# Patient Record
Sex: Male | Born: 1945 | Race: White | Hispanic: No | Marital: Married | Smoking: Current every day smoker
Health system: Southern US, Community
[De-identification: ages and names within clinical notes are randomized; demographics above are authoritative.]

## PROBLEM LIST (undated history)

## (undated) DIAGNOSIS — I10 Essential (primary) hypertension: Secondary | ICD-10-CM

## (undated) DIAGNOSIS — M5020 Other cervical disc displacement, unspecified cervical region: Secondary | ICD-10-CM

## (undated) DIAGNOSIS — E785 Hyperlipidemia, unspecified: Secondary | ICD-10-CM

## (undated) DIAGNOSIS — A281 Cat-scratch disease: Secondary | ICD-10-CM

## (undated) HISTORY — DX: Cat-scratch disease: A28.1

## (undated) HISTORY — DX: Other cervical disc displacement, unspecified cervical region: M50.20

## (undated) HISTORY — DX: Essential (primary) hypertension: I10

## (undated) HISTORY — PX: TONSILLECTOMY: SHX5217

## (undated) HISTORY — PX: CHOLECYSTECTOMY: SHX55

## (undated) HISTORY — DX: Hyperlipidemia, unspecified: E78.5

---

## 2004-09-22 ENCOUNTER — Ambulatory Visit: Payer: Self-pay | Admitting: Family Medicine

## 2004-10-20 ENCOUNTER — Ambulatory Visit: Payer: Self-pay | Admitting: Family Medicine

## 2004-10-25 ENCOUNTER — Ambulatory Visit: Payer: Self-pay | Admitting: Family Medicine

## 2005-12-13 ENCOUNTER — Ambulatory Visit: Payer: Self-pay | Admitting: Family Medicine

## 2008-05-21 ENCOUNTER — Telehealth: Payer: Self-pay | Admitting: Physician Assistant

## 2008-05-22 ENCOUNTER — Ambulatory Visit: Payer: Self-pay | Admitting: Gastroenterology

## 2008-05-22 DIAGNOSIS — R1032 Left lower quadrant pain: Secondary | ICD-10-CM | POA: Insufficient documentation

## 2008-05-22 DIAGNOSIS — K625 Hemorrhage of anus and rectum: Secondary | ICD-10-CM | POA: Insufficient documentation

## 2008-05-22 DIAGNOSIS — K5900 Constipation, unspecified: Secondary | ICD-10-CM | POA: Insufficient documentation

## 2008-05-22 LAB — CONVERTED CEMR LAB
Basophils Relative: 1.6 % (ref 0.0–3.0)
Eosinophils Relative: 2.5 % (ref 0.0–5.0)
HCT: 44.1 % (ref 39.0–52.0)
Lymphs Abs: 2.8 10*3/uL (ref 0.7–4.0)
MCV: 90.9 fL (ref 78.0–100.0)
Monocytes Absolute: 1.5 10*3/uL — ABNORMAL HIGH (ref 0.1–1.0)
Monocytes Relative: 8.9 % (ref 3.0–12.0)
Platelets: 282 10*3/uL (ref 150.0–400.0)
RBC: 4.85 M/uL (ref 4.22–5.81)
WBC: 17 10*3/uL — ABNORMAL HIGH (ref 4.5–10.5)

## 2008-06-09 ENCOUNTER — Encounter: Payer: Self-pay | Admitting: Gastroenterology

## 2008-06-09 ENCOUNTER — Ambulatory Visit: Payer: Self-pay | Admitting: Gastroenterology

## 2008-06-14 ENCOUNTER — Encounter: Payer: Self-pay | Admitting: Gastroenterology

## 2011-04-12 ENCOUNTER — Encounter: Payer: Self-pay | Admitting: Gastroenterology

## 2012-04-10 ENCOUNTER — Encounter: Payer: Self-pay | Admitting: Gastroenterology

## 2012-06-13 DIAGNOSIS — C44311 Basal cell carcinoma of skin of nose: Secondary | ICD-10-CM | POA: Insufficient documentation

## 2013-09-23 ENCOUNTER — Encounter (INDEPENDENT_AMBULATORY_CARE_PROVIDER_SITE_OTHER): Payer: Self-pay

## 2013-09-23 ENCOUNTER — Ambulatory Visit (INDEPENDENT_AMBULATORY_CARE_PROVIDER_SITE_OTHER): Payer: Medicare Other | Admitting: Family Medicine

## 2013-09-23 VITALS — BP 122/75 | HR 73 | Temp 96.9°F | Ht 71.0 in | Wt 212.0 lb

## 2013-09-23 DIAGNOSIS — M5416 Radiculopathy, lumbar region: Secondary | ICD-10-CM

## 2013-09-23 DIAGNOSIS — IMO0002 Reserved for concepts with insufficient information to code with codable children: Secondary | ICD-10-CM

## 2013-09-23 MED ORDER — METHYLPREDNISOLONE (PAK) 4 MG PO TABS: ORAL_TABLET | ORAL | Status: DC

## 2013-09-23 NOTE — Progress Notes (Signed)
   Subjective:    Patient ID: [name], male    DOB: 1945/07/19, 68 y.o.   MRN: [id number]  HPI This 68 y.o. male presents for evaluation of left back pain that radiates down left leg.   Review of Systems No chest pain, SOB, HA, dizziness, vision change, N/V, diarrhea, constipation, dysuria, urinary urgency or frequency, myalgias, arthralgias or rash.     Objective:   Physical Exam  Vital signs noted  Well developed well nourished male.  HEENT - Head ATNC Respiratory - Lungs CTA bilateral Cardiac - RRR S1 and S2 without murmur GI - Abdomen soft Nontender and bowel sounds active x 4 Extremities - No edema. Neuro - Grossly intact. MS - TTP left lumbar paraspinous muscles     Assessment & Plan:  Lumbar radiculopathy - Plan: methylPREDNIsolone (MEDROL DOSPACK) 4 MG tablet  Lysbeth Penner FNP

## 2013-11-11 ENCOUNTER — Encounter: Payer: Self-pay | Admitting: Family Medicine

## 2013-11-14 ENCOUNTER — Ambulatory Visit (INDEPENDENT_AMBULATORY_CARE_PROVIDER_SITE_OTHER): Payer: Medicare Other | Admitting: Family Medicine

## 2013-11-14 ENCOUNTER — Encounter: Payer: Self-pay | Admitting: Family Medicine

## 2013-11-14 VITALS — BP 123/76 | HR 76 | Temp 96.5°F | Ht 71.0 in | Wt 207.4 lb

## 2013-11-14 DIAGNOSIS — R208 Other disturbances of skin sensation: Secondary | ICD-10-CM

## 2013-11-14 DIAGNOSIS — I10 Essential (primary) hypertension: Secondary | ICD-10-CM

## 2013-11-14 DIAGNOSIS — R200 Anesthesia of skin: Secondary | ICD-10-CM

## 2013-11-14 DIAGNOSIS — E785 Hyperlipidemia, unspecified: Secondary | ICD-10-CM

## 2013-11-14 NOTE — Progress Notes (Signed)
   Subjective:    Patient ID: Carl Horne, male    DOB: 02/28/1945, 68 y.o.   MRN: [id number]  HPI 68 year old gentleman who gets care at the [place] as well as here. He is relatively healthy, being treated only for hypertension and hyperlipidemia. He has a past history of lumbar disc disease which flared up some years ago but then again more recently after crawling around under his house. He now has numbness on the lateral aspect of his left leg but denies pain.    Review of Systems  Respiratory: Positive for cough.        Objective:   Physical Exam  Constitutional: He is oriented to person, place, and time. He appears well-developed and well-nourished.  HENT:  Head: Normocephalic.  Eyes: Pupils are equal, round, and reactive to light.  Cardiovascular: Normal rate.   Pulmonary/Chest: Effort normal.  Musculoskeletal: Normal range of motion.  Straight leg raising is negative. Reflexes are diminished on the left compared to the right. There is some decrease in strength with dorsiflexion of the left foot  Neurological: He is alert and oriented to person, place, and time.    BP 123/76 mmHg  Pulse 76  Temp(Src) 96.5 F (35.8 C) (Oral)  Ht 5\' 11"  (1.803 m)  Wt 207 lb 6.4 oz (94.076 kg)  BMI 28.94 kg/m2      Assessment & Plan:  1. Essential hypertension   2. Hyperlipidemia   3. Numbness in left leg Suspect he has recurrence of lumbar disc disease. He is scheduled for MRI at the [place] in 3 weeks I have encouraged him to keep that appointment but if symptoms worsen before but has not  He says that he gets his lab work such as lipids and liver done at the [place]. I will assume this is adequate but have asked him to try and procure these results  Wardell Honour MD

## 2013-11-14 NOTE — Patient Instructions (Signed)

## 2013-12-15 ENCOUNTER — Ambulatory Visit: Payer: Medicare Other | Admitting: *Deleted

## 2013-12-15 ENCOUNTER — Ambulatory Visit (INDEPENDENT_AMBULATORY_CARE_PROVIDER_SITE_OTHER): Payer: Medicare Other | Admitting: *Deleted

## 2013-12-15 DIAGNOSIS — Z23 Encounter for immunization: Secondary | ICD-10-CM

## 2014-03-10 DIAGNOSIS — M4726 Other spondylosis with radiculopathy, lumbar region: Secondary | ICD-10-CM | POA: Insufficient documentation

## 2014-03-10 DIAGNOSIS — M21372 Foot drop, left foot: Secondary | ICD-10-CM | POA: Insufficient documentation

## 2014-03-10 DIAGNOSIS — M5136 Other intervertebral disc degeneration, lumbar region: Secondary | ICD-10-CM | POA: Insufficient documentation

## 2014-03-10 DIAGNOSIS — M4800 Spinal stenosis, site unspecified: Secondary | ICD-10-CM | POA: Insufficient documentation

## 2014-03-20 ENCOUNTER — Ambulatory Visit: Payer: Self-pay | Admitting: Family Medicine

## 2014-03-20 DIAGNOSIS — J069 Acute upper respiratory infection, unspecified: Secondary | ICD-10-CM | POA: Diagnosis not present

## 2014-04-22 DIAGNOSIS — J069 Acute upper respiratory infection, unspecified: Secondary | ICD-10-CM | POA: Diagnosis not present

## 2014-08-06 ENCOUNTER — Encounter: Payer: Self-pay | Admitting: Gastroenterology

## 2014-11-04 ENCOUNTER — Encounter: Payer: Self-pay | Admitting: Family Medicine

## 2014-11-05 ENCOUNTER — Encounter: Payer: Self-pay | Admitting: Family Medicine

## 2014-12-01 ENCOUNTER — Ambulatory Visit (INDEPENDENT_AMBULATORY_CARE_PROVIDER_SITE_OTHER): Payer: Medicare Other | Admitting: Family Medicine

## 2014-12-01 ENCOUNTER — Encounter: Payer: Self-pay | Admitting: Family Medicine

## 2014-12-01 VITALS — BP 131/78 | HR 66 | Temp 96.7°F | Ht 71.0 in | Wt 216.0 lb

## 2014-12-01 DIAGNOSIS — I10 Essential (primary) hypertension: Secondary | ICD-10-CM

## 2014-12-01 DIAGNOSIS — E785 Hyperlipidemia, unspecified: Secondary | ICD-10-CM | POA: Diagnosis not present

## 2014-12-01 NOTE — Progress Notes (Signed)
   Subjective:    Patient ID: Carl Horne, male    DOB: January 13, 1945, 69 y.o.   MRN: [id number]  HPI  69 year old gentleman who is here for follow-up check on blood pressure and lipids. He is followed for these problems at the [facility] clinic in [place] and has had blood work there. He has a history of disc disease in his back with resultant foot drop on the left side. He wears an AFO splint when he is going to do lots of walking. He denies pain. He has had symptoms for the past 2 years.  Patient Active Problem List   Diagnosis Date Noted  . CONSTIPATION 05/22/2008  . RECTAL BLEEDING 05/22/2008  . Abdominal pain, left lower quadrant 05/22/2008   Outpatient Encounter Prescriptions as of 12/01/2014  Medication Sig  . aspirin EC 81 MG tablet Take 81 mg by mouth daily.  . Cholecalciferol (VITAMIN D) 2000 UNITS CAPS Take 1 capsule by mouth daily.  . cloNIDine (CATAPRES) 0.3 MG tablet Take 0.3 mg by mouth 2 (two) times daily.  Marland Kitchen lisinopril (PRINIVIL,ZESTRIL) 40 MG tablet Take 40 mg by mouth daily.  . pravastatin (PRAVACHOL) 20 MG tablet Take 20 mg by mouth daily.   No facility-administered encounter medications on file as of 12/01/2014.      Review of Systems  Constitutional: Negative.   Respiratory: Negative.   Cardiovascular: Negative.   Genitourinary: Negative.   Neurological: Negative.   Psychiatric/Behavioral: Negative.        Objective:   Physical Exam  Constitutional: He is oriented to person, place, and time. He appears well-nourished.  Cardiovascular: Normal rate and regular rhythm.   Pulmonary/Chest: Effort normal and breath sounds normal.  Neurological: He is alert and oriented to person, place, and time. He exhibits abnormal muscle tone.  Foot drop is evident on the left side. He is unable to do heel walking.          Assessment & Plan:  1. Essential hypertension Blood pressure is well controlled on combination clonidine and lisinopril  2. Hyperlipidemia I have  no results of lipid and liver labs that he gets at the [place]. I have asked him to bring a report so we can scan that into his record. Otherwise it looks like we are not following him very well.  Wardell Honour MD

## 2015-01-21 ENCOUNTER — Encounter: Payer: Self-pay | Admitting: Family Medicine

## 2015-01-21 ENCOUNTER — Ambulatory Visit (INDEPENDENT_AMBULATORY_CARE_PROVIDER_SITE_OTHER): Payer: Medicare Other | Admitting: Family Medicine

## 2015-01-21 VITALS — BP 153/90 | HR 77 | Temp 96.9°F | Ht 71.0 in | Wt 214.6 lb

## 2015-01-21 DIAGNOSIS — I10 Essential (primary) hypertension: Secondary | ICD-10-CM | POA: Diagnosis not present

## 2015-01-21 MED ORDER — SILDENAFIL CITRATE 100 MG PO TABS: 50.0000 mg | ORAL_TABLET | Freq: Every day | ORAL | Status: DC | PRN

## 2015-01-21 NOTE — Progress Notes (Signed)
   Subjective:    Patient ID: Carl Horne, male    DOB: 1945/09/03, 70 y.o.   MRN: [id number]  HPI 70 year old gentleman who is here to follow-up hypertension and hyperlipidemia. He is also seen in the [place] clinic in [place] and he brings in today report of electrolytes renal function liver functions done in November at the [facility] clinic all of these are within normal limits he did not bring his cholesterol and lipid labs and have asked him to try to get that done when he goes back to the [place] probably in May of this year.  His other concern today is erectile dysfunction. He had tried some Viagra that her friend had given him before with good success  Patient Active Problem List   Diagnosis Date Noted  . CONSTIPATION 05/22/2008  . RECTAL BLEEDING 05/22/2008  . Abdominal pain, left lower quadrant 05/22/2008   Outpatient Encounter Prescriptions as of 01/21/2015  Medication Sig  . aspirin EC 81 MG tablet Take 81 mg by mouth daily.  . Cholecalciferol (VITAMIN D) 2000 UNITS CAPS Take 1 capsule by mouth daily.  . cloNIDine (CATAPRES) 0.3 MG tablet Take 0.3 mg by mouth 2 (two) times daily.  Marland Kitchen lisinopril (PRINIVIL,ZESTRIL) 40 MG tablet Take 40 mg by mouth daily.  . pravastatin (PRAVACHOL) 20 MG tablet Take 20 mg by mouth daily.   No facility-administered encounter medications on file as of 01/21/2015.      Review of Systems  Constitutional: Negative.   Respiratory: Negative.   Cardiovascular: Negative.   Neurological: Negative.   Psychiatric/Behavioral: Negative.        Objective:   Physical Exam  Constitutional: He is oriented to person, place, and time. He appears well-developed and well-nourished.  Cardiovascular: Normal rate and regular rhythm.   Pulmonary/Chest: Effort normal and breath sounds normal.  Neurological: He is alert and oriented to person, place, and time.  Psychiatric: He has a normal mood and affect. His behavior is normal.          Assessment & Plan:  1.  Essential hypertension Currently patient is on clonidine 0.3 mg twice a day and lisinopril 40 mg every morning. Blood pressures are elevated today.  I asked him to monitor pressure at home. Also asked him to bring back for results of lipid measurement in now 4 months.  Wardell Honour MD

## 2015-07-07 ENCOUNTER — Ambulatory Visit (INDEPENDENT_AMBULATORY_CARE_PROVIDER_SITE_OTHER): Payer: Medicare Other | Admitting: Family

## 2015-07-07 ENCOUNTER — Encounter: Payer: Self-pay | Admitting: Family

## 2015-07-07 VITALS — BP 156/85 | HR 70 | Temp 97.0°F | Ht 71.0 in | Wt 205.6 lb

## 2015-07-07 DIAGNOSIS — M25512 Pain in left shoulder: Secondary | ICD-10-CM

## 2015-07-07 DIAGNOSIS — S46912A Strain of unspecified muscle, fascia and tendon at shoulder and upper arm level, left arm, initial encounter: Secondary | ICD-10-CM

## 2015-07-07 MED ORDER — NAPROXEN 500 MG PO TABS: 500.0000 mg | ORAL_TABLET | Freq: Two times a day (BID) | ORAL | Status: DC

## 2015-07-07 MED ORDER — PREDNISONE 10 MG (21) PO TBPK: 10.0000 mg | ORAL_TABLET | Freq: Every day | ORAL | Status: DC

## 2015-07-07 NOTE — Patient Instructions (Signed)
Shoulder Sprain °A shoulder sprain is a partial or complete tear in one of the tough, fiber-like tissues (ligaments) in the shoulder. The ligaments in the shoulder help to hold the shoulder in place. °CAUSES °This condition may be caused by: °· A fall. °· A hit to the shoulder. °· A twist of the arm. °RISK FACTORS °This condition is more likely to develop in: °· People who play sports. °· People who have problems with balance or coordination. °SYMPTOMS °Symptoms of this condition include: °· Pain when moving the shoulder. °· Limited ability to move the shoulder. °· Swelling and tenderness on top of the shoulder. °· Warmth in the shoulder. °· A change in the shape of the shoulder. °· Redness or bruising on the shoulder. °DIAGNOSIS °This condition is diagnosed with a physical exam. During the exam, you may be asked to do simple exercises with your shoulder. You may also have imaging tests, such as X-rays, MRI, or a CT scan. These tests can show how severe the sprain is. °TREATMENT °This condition may be treated with: °· Rest. °· Pain medicine. °· Ice. °· A sling or brace. This is used to keep the arm still while the shoulder is healing. °· Physical therapy or rehabilitation exercises. These help to improve the range of motion and strength of the shoulder. °· Surgery (rare). Surgery may be needed if the sprain caused a joint to become unstable. Surgery may also be needed to reduce pain. °Some people may develop ongoing shoulder pain or lose some range of motion in the shoulder. However, most people do not develop long-term problems. °HOME CARE INSTRUCTIONS °· Rest. °· Take over-the-counter and prescription medicines only as told by your health care provider. °· If directed, apply ice to the area: °¨ Put ice in a plastic bag. °¨ Place a towel between your skin and the bag. °¨ Leave the ice on for 20 minutes, 2-3 times per day. °· If you were given a shoulder sling or brace: °¨ Wear it as told. °¨ Remove it to shower or  bathe. °¨ Move your arm only as much as told by your health care provider, but keep your hand moving to prevent swelling. °· If you were shown how to do any exercises, do them as told by your health care provider. °· Keep all follow-up visits as told by your health care provider. This is important. °SEEK MEDICAL CARE IF: °· Your pain gets worse. °· Your pain is not relieved with medicines. °· You have increased redness or swelling. °SEEK IMMEDIATE MEDICAL CARE IF: °· You have a fever. °· You cannot move your arm or shoulder. °· You develop numbness or tingling in your arms, hands, or fingers. °  °This information is not intended to replace advice given to you by your health care provider. Make sure you discuss any questions you have with your health care provider. °  °Document Released: 05/07/2008 Document Revised: 09/09/2014 Document Reviewed: 04/13/2014 °Elsevier Interactive Patient Education ©2016 [employer] ° °

## 2015-07-07 NOTE — Progress Notes (Signed)
   Subjective:    Patient ID: Carl Horne, male    DOB: 11/26/1945, 70 y.o.   MRN: [id number]  Shoulder Pain  The pain is present in the neck and left shoulder. This is a new problem. The current episode started in the past 7 days (Left). There has been a history of trauma ("hauled gravel with a shovel last week"). The problem occurs constantly. The problem has been unchanged. The quality of the pain is described as aching. The pain is at a severity of 10/10. The pain is severe. Associated symptoms include joint swelling and stiffness. Pertinent negatives include no inability to bear weight, limited range of motion, numbness or tingling. The symptoms are aggravated by activity. He has tried rest and acetaminophen for the symptoms. The treatment provided no relief.      Review of Systems  Constitutional: Negative.   HENT: Negative.   Respiratory: Negative.   Cardiovascular: Negative.   Gastrointestinal: Negative.   Endocrine: Negative.   Genitourinary: Negative.   Musculoskeletal: Positive for joint swelling, arthralgias, stiffness and neck pain.  Neurological: Negative.  Negative for tingling and numbness.  Hematological: Negative.   Psychiatric/Behavioral: Negative.   All other systems reviewed and are negative.      Objective:   Physical Exam  Constitutional: He is oriented to person, place, and time. He appears well-developed and well-nourished. No distress.  HENT:  Head: Normocephalic.  Neck: Normal range of motion. Neck supple. No thyromegaly present.  Cardiovascular: Normal rate, regular rhythm, normal heart sounds and intact distal pulses.   No murmur heard. Pulmonary/Chest: Effort normal and breath sounds normal. No respiratory distress. He has no wheezes.  Abdominal: Soft. Bowel sounds are normal. He exhibits no distension. There is no tenderness.  Musculoskeletal: Normal range of motion. He exhibits no edema or tenderness.  FUll ROM of left shoulder, pain with flexion  and rotation  Neurological: He is alert and oriented to person, place, and time.  Skin: Skin is warm and dry. No rash noted. No erythema.  Psychiatric: He has a normal mood and affect. His behavior is normal. Judgment and thought content normal.  Vitals reviewed.   BP 156/85 mmHg  Pulse 70  Temp(Src) 97 F (36.1 C) (Oral)  Ht 5\' 11"  (1.803 m)  Wt 205 lb 9.6 oz (93.26 kg)  BMI 28.69 kg/m2       Assessment & Plan:  1. Left shoulder pain - predniSONE (STERAPRED UNI-PAK 21 TAB) 10 MG (21) TBPK tablet; Take 1 tablet (10 mg total) by mouth daily. As directed x 6 days  Dispense: 21 tablet; Refill: 0 - naproxen (NAPROSYN) 500 MG tablet; Take 1 tablet (500 mg total) by mouth 2 (two) times daily with a meal.  Dispense: 60 tablet; Refill: 1  2. Left shoulder strain, initial encounter -Rest -Ice -ROM exercises encouraged -Take naprosyn for 5-7 days BID with food -RTO in 2 weeks if not improved - predniSONE (STERAPRED UNI-PAK 21 TAB) 10 MG (21) TBPK tablet; Take 1 tablet (10 mg total) by mouth daily. As directed x 6 days  Dispense: 21 tablet; Refill: 0 - naproxen (NAPROSYN) 500 MG tablet; Take 1 tablet (500 mg total) by mouth 2 (two) times daily with a meal.  Dispense: 60 tablet; Refill: [place], FNP

## 2015-07-15 ENCOUNTER — Ambulatory Visit (INDEPENDENT_AMBULATORY_CARE_PROVIDER_SITE_OTHER): Payer: Medicare Other | Admitting: Nurse Practitioner

## 2015-07-15 ENCOUNTER — Encounter: Payer: Self-pay | Admitting: Nurse Practitioner

## 2015-07-15 VITALS — BP 115/69 | HR 72 | Temp 97.2°F | Ht 71.0 in | Wt 207.0 lb

## 2015-07-15 DIAGNOSIS — M542 Cervicalgia: Secondary | ICD-10-CM | POA: Diagnosis not present

## 2015-07-15 DIAGNOSIS — M25512 Pain in left shoulder: Secondary | ICD-10-CM

## 2015-07-15 MED ORDER — METHYLPREDNISOLONE ACETATE 80 MG/ML IJ SUSP
80.0000 mg | Freq: Once | INTRAMUSCULAR | Status: AC
  Administered 2015-07-15: 80 mg via INTRAMUSCULAR

## 2015-07-15 MED ORDER — TRAMADOL HCL 50 MG PO TABS: 50.0000 mg | ORAL_TABLET | Freq: Three times a day (TID) | ORAL | Status: DC | PRN

## 2015-07-15 MED ORDER — CYCLOBENZAPRINE HCL 5 MG PO TABS: 5.0000 mg | ORAL_TABLET | Freq: Three times a day (TID) | ORAL | Status: DC | PRN

## 2015-07-15 NOTE — Patient Instructions (Signed)

## 2015-07-15 NOTE — Progress Notes (Signed)
   Subjective:    Patient ID: Carl Horne, male    DOB: [birth year], 70 y.o.   MRN: [id number]  HPI Patient in c/o left shoulder , neck and upper back pain- started 2 weeks ago-he was loading gravel off of a pick up truck and started hurting 2 days later. Describes pain as throbbing and aching- rates pain 10-10 currently. Sitting straight up and walking around increases pain- sitting with pressure on back relieves pain some.    Review of Systems  Respiratory: Negative.  Negative for shortness of breath.   Cardiovascular: Positive for chest pain.  Genitourinary: Negative.   Neurological: Negative.   Psychiatric/Behavioral: Negative.   All other systems reviewed and are negative.      Objective:   Physical Exam  Constitutional: He is oriented to person, place, and time. He appears well-developed and well-nourished.  Cardiovascular: Normal rate, regular rhythm and normal heart sounds.   Pulmonary/Chest: Effort normal and breath sounds normal.  Musculoskeletal:  FROM of left shoulder with slight pain on internal rotation. Pain on palpation of lower  Cervical spine  FROM of neck without pain Left grip slightly weaker then right  Neurological: He is alert and oriented to person, place, and time. He has normal reflexes.  Skin: Skin is warm.  Psychiatric: He has a normal mood and affect. His behavior is normal. Judgment and thought content normal.   BP 115/69 mmHg  Pulse 72  Temp(Src) 97.2 F (36.2 C) (Oral)  Ht 5\' 11"  (1.803 m)  Wt 207 lb (93.895 kg)  BMI 28.88 kg/m2        Assessment & Plan:   1. Left shoulder pain   2. Cervical pain (neck)    Meds ordered this encounter  Medications  . cyclobenzaprine (FLEXERIL) 5 MG tablet    Sig: Take 1 tablet (5 mg total) by mouth 3 (three) times daily as needed for muscle spasms.    Dispense:  30 tablet    Refill:  2    Order Specific Question:  Supervising Provider    Answer:  VINCENT, CAROL L [[id number]]  . methylPREDNISolone  acetate (DEPO-MEDROL) injection 80 mg    Sig:   . traMADol (ULTRAM) 50 MG tablet    Sig: Take 1 tablet (50 mg total) by mouth every 8 (eight) hours as needed.    Dispense:  20 tablet    Refill:  0    Order Specific Question:  Supervising Provider    Answer:  Evette Doffing, CAROL L [[id number]]   Moist heat Rest RTO prn  Mary-Margaret Hassell Done, FNP

## 2015-07-19 ENCOUNTER — Encounter: Payer: Self-pay | Admitting: Family

## 2015-07-19 ENCOUNTER — Ambulatory Visit (INDEPENDENT_AMBULATORY_CARE_PROVIDER_SITE_OTHER): Payer: Medicare Other | Admitting: Family

## 2015-07-19 VITALS — BP 176/86 | HR 64 | Temp 96.8°F | Ht 71.0 in | Wt 205.6 lb

## 2015-07-19 DIAGNOSIS — M542 Cervicalgia: Secondary | ICD-10-CM | POA: Diagnosis not present

## 2015-07-19 DIAGNOSIS — M25512 Pain in left shoulder: Secondary | ICD-10-CM | POA: Diagnosis not present

## 2015-07-19 MED ORDER — KETOROLAC TROMETHAMINE 60 MG/2ML IM SOLN: 30.0000 mg | Freq: Once | INTRAMUSCULAR | Status: DC

## 2015-07-19 MED ORDER — CYCLOBENZAPRINE HCL 10 MG PO TABS: 10.0000 mg | ORAL_TABLET | Freq: Three times a day (TID) | ORAL | Status: DC | PRN

## 2015-07-19 MED ORDER — KETOROLAC TROMETHAMINE 60 MG/2ML IM SOLN
30.0000 mg | Freq: Once | INTRAMUSCULAR | Status: AC
  Administered 2015-07-19: 30 mg via INTRAMUSCULAR

## 2015-07-19 MED ORDER — TRAMADOL HCL 50 MG PO TABS: 100.0000 mg | ORAL_TABLET | Freq: Three times a day (TID) | ORAL | Status: DC | PRN

## 2015-07-19 NOTE — Progress Notes (Signed)
Subjective:    Patient ID: Carl Horne, male    DOB: 08-04-1945, 70 y.o.   MRN: [id number]  Neck Pain  This is a new problem. The current episode started 1 to 4 weeks ago. The problem occurs constantly. The problem has been gradually worsening. The pain is associated with nothing. Associated symptoms include numbness and tingling (left hand). Pertinent negatives include no leg pain.  Back Pain This is a recurrent problem. The current episode started 1 to 4 weeks ago. The problem occurs constantly. The pain is present in the thoracic spine. The quality of the pain is described as aching. The pain is at a severity of 10/10. The pain is moderate. Associated symptoms include numbness and tingling (left hand). Pertinent negatives include no bladder incontinence, bowel incontinence, dysuria or leg pain. He has tried muscle relaxant and bed rest for the symptoms. The treatment provided mild relief.      Review of Systems  Constitutional: Negative.   HENT: Negative.   Respiratory: Negative.   Cardiovascular: Negative.   Gastrointestinal: Negative.  Negative for bowel incontinence.  Endocrine: Negative.   Genitourinary: Negative.  Negative for bladder incontinence and dysuria.  Musculoskeletal: Positive for back pain and neck pain.  Neurological: Positive for tingling (left hand) and numbness.  Hematological: Negative.   Psychiatric/Behavioral: Negative.   All other systems reviewed and are negative.      Objective:   Physical Exam  Constitutional: He is oriented to person, place, and time. He appears well-developed and well-nourished. No distress.  Cardiovascular: Normal rate, regular rhythm, normal heart sounds and intact distal pulses.   No murmur heard. Pulmonary/Chest: Effort normal and breath sounds normal. No respiratory distress. He has no wheezes.  Abdominal: Soft. Bowel sounds are normal. He exhibits no distension. There is no tenderness.  Musculoskeletal: He exhibits no edema  or tenderness.  Pain with rotation of left shoulder, pain in lower cervical area with bending   Neurological: He is alert and oriented to person, place, and time. He has normal reflexes. No cranial nerve deficit.  Skin: Skin is warm and dry. No rash noted. No erythema.  Psychiatric: He has a normal mood and affect. His behavior is normal. Judgment and thought content normal.  Vitals reviewed.   BP 176/86 mmHg  Pulse 64  Temp(Src) 96.8 F (36 C) (Oral)  Ht 5\' 11"  (1.803 m)  Wt 205 lb 9.6 oz (93.26 kg)  BMI 28.69 kg/m2       Assessment & Plan:  1. Cervical pain (neck) - Ambulatory referral to Orthopedic Surgery - traMADol (ULTRAM) 50 MG tablet; Take 2 tablets (100 mg total) by mouth every 8 (eight) hours as needed.  Dispense: 30 tablet; Refill: 0 - cyclobenzaprine (FLEXERIL) 10 MG tablet; Take 1 tablet (10 mg total) by mouth 3 (three) times daily as needed for muscle spasms.  Dispense: 30 tablet; Refill: 0 - ketorolac (TORADOL) injection 30 mg; Inject 1 mL (30 mg total) into the muscle once.  2. Left shoulder pain - Ambulatory referral to Orthopedic Surgery - traMADol (ULTRAM) 50 MG tablet; Take 2 tablets (100 mg total) by mouth every 8 (eight) hours as needed.  Dispense: 30 tablet; Refill: 0 - cyclobenzaprine (FLEXERIL) 10 MG tablet; Take 1 tablet (10 mg total) by mouth 3 (three) times daily as needed for muscle spasms.  Dispense: 30 tablet; Refill: 0 - ketorolac (TORADOL) injection 30 mg; Inject 1 mL (30 mg total) into the muscle once.  Rest Ice and heat as needed  Increased flexeril to 10 mg from 5 mg Told to continue naprosyn -Ultram rx increased to 100 mg from 50 mg Referral pending  Evelina Dun, FNP

## 2015-07-19 NOTE — Patient Instructions (Signed)

## 2015-08-19 DIAGNOSIS — M542 Cervicalgia: Secondary | ICD-10-CM | POA: Diagnosis not present

## 2016-06-05 ENCOUNTER — Telehealth: Payer: Self-pay | Admitting: Family Medicine

## 2016-09-01 ENCOUNTER — Ambulatory Visit (INDEPENDENT_AMBULATORY_CARE_PROVIDER_SITE_OTHER): Payer: Medicare Other | Admitting: Physician Assistant

## 2016-09-01 ENCOUNTER — Encounter: Payer: Self-pay | Admitting: Physician Assistant

## 2016-09-01 VITALS — BP 98/57 | HR 67 | Temp 96.7°F | Ht 71.0 in | Wt 210.0 lb

## 2016-09-01 DIAGNOSIS — E7800 Pure hypercholesterolemia, unspecified: Secondary | ICD-10-CM | POA: Diagnosis not present

## 2016-09-01 DIAGNOSIS — L03113 Cellulitis of right upper limb: Secondary | ICD-10-CM | POA: Diagnosis not present

## 2016-09-01 DIAGNOSIS — I10 Essential (primary) hypertension: Secondary | ICD-10-CM | POA: Diagnosis not present

## 2016-09-01 MED ORDER — CLONIDINE HCL 0.3 MG PO TABS: 0.3000 mg | ORAL_TABLET | Freq: Two times a day (BID) | ORAL | 0 refills | Status: DC

## 2016-09-01 MED ORDER — CLINDAMYCIN HCL 300 MG PO CAPS: 300.0000 mg | ORAL_CAPSULE | Freq: Three times a day (TID) | ORAL | 0 refills | Status: DC

## 2016-09-01 NOTE — Patient Instructions (Signed)

## 2016-09-03 DIAGNOSIS — W5503XA Scratched by cat, initial encounter: Secondary | ICD-10-CM | POA: Diagnosis not present

## 2016-09-03 DIAGNOSIS — I10 Essential (primary) hypertension: Secondary | ICD-10-CM | POA: Diagnosis not present

## 2016-09-03 DIAGNOSIS — Z7982 Long term (current) use of aspirin: Secondary | ICD-10-CM | POA: Diagnosis not present

## 2016-09-03 DIAGNOSIS — Z79899 Other long term (current) drug therapy: Secondary | ICD-10-CM | POA: Diagnosis not present

## 2016-09-03 DIAGNOSIS — L03113 Cellulitis of right upper limb: Secondary | ICD-10-CM | POA: Diagnosis not present

## 2016-09-04 DIAGNOSIS — E7800 Pure hypercholesterolemia, unspecified: Secondary | ICD-10-CM | POA: Insufficient documentation

## 2016-09-04 NOTE — Progress Notes (Signed)
BP (!) 98/57   Pulse 67   Temp (!) 96.7 F (35.9 C) (Oral)   Ht 5\' 11"  (1.803 m)   Wt 210 lb (95.3 kg)   BMI 29.29 kg/m    Subjective:    Patient ID: Carl Horne, male    DOB: [birth year], 71 y.o.   MRN: [id number]  HPI: Carl Horne is a 71 y.o. male presenting on 09/01/2016 for Hand Problem (Red and swelling. Was scratched by cat )  He had swelling come up in the past 2 days and warmth to the touch. Denies fever or chills.  He has history of gout but this does not feel the same.  He has not had pus or drainage.    He also needs refills on meds for high blood pressure and hyperlipidemia. Feels well on them.  Relevant past medical, surgical, family and social history reviewed and updated as indicated. Allergies and medications reviewed and updated.  Past Medical History:  Diagnosis Date  . Herniated cervical disc   . Hyperlipidemia   . Hypertension     Past Surgical History:  Procedure Laterality Date  . CHOLECYSTECTOMY    . TONSILLECTOMY      Review of Systems  Constitutional: Negative.  Negative for appetite change and fatigue.  Eyes: Negative for pain and visual disturbance.  Respiratory: Negative.  Negative for cough, chest tightness, shortness of breath and wheezing.   Cardiovascular: Negative.  Negative for chest pain, palpitations and leg swelling.  Gastrointestinal: Negative.  Negative for abdominal pain, diarrhea, nausea and vomiting.  Genitourinary: Negative.   Skin: Positive for color change and wound. Negative for rash.  Neurological: Negative.  Negative for weakness, numbness and headaches.  Psychiatric/Behavioral: Negative.     Allergies as of 09/01/2016   No Known Allergies     Medication List       Accurate as of 09/01/16 11:59 PM. Always use your most recent med list.          aspirin EC 81 MG tablet Take 81 mg by mouth daily.   atorvastatin 20 MG tablet Commonly known as:  LIPITOR Take 20 mg by mouth daily.   clindamycin 300 MG  capsule Commonly known as:  CLEOCIN Take 1 capsule (300 mg total) by mouth 3 (three) times daily.   cloNIDine 0.3 MG tablet Commonly known as:  CATAPRES Take 1 tablet (0.3 mg total) by mouth 2 (two) times daily.   lisinopril 40 MG tablet Commonly known as:  PRINIVIL,ZESTRIL Take 40 mg by mouth daily.   Vitamin D 2000 units Caps Take 1 capsule by mouth daily.            Discharge Care Instructions        Start     Ordered   09/01/16 0000  clindamycin (CLEOCIN) 300 MG capsule  3 times daily    Question:  Supervising Provider  Answer:  Timmothy Euler   09/01/16 0847   09/01/16 0000  cloNIDine (CATAPRES) 0.3 MG tablet  2 times daily    Question:  Supervising Provider  Answer:  Timmothy Euler   09/01/16 0849         Objective:    BP (!) 98/57   Pulse 67   Temp (!) 96.7 F (35.9 C) (Oral)   Ht 5\' 11"  (1.803 m)   Wt 210 lb (95.3 kg)   BMI 29.29 kg/m   No Known Allergies  Physical Exam  Constitutional: He appears well-developed and well-nourished. No  distress.  HENT:  Head: Normocephalic and atraumatic.  Eyes: Pupils are equal, round, and reactive to light. Conjunctivae and EOM are normal.  Cardiovascular: Normal rate, regular rhythm and normal heart sounds.   Pulmonary/Chest: Effort normal and breath sounds normal. No respiratory distress.  Skin: Skin is warm and dry. Rash noted. Rash is maculopapular. Rash is not pustular. There is erythema.     Warm and red right wrist  Psychiatric: He has a normal mood and affect. His behavior is normal.  Nursing note and vitals reviewed.       Assessment & Plan:   1. Cellulitis of right upper extremity - clindamycin (CLEOCIN) 300 MG capsule; Take 1 capsule (300 mg total) by mouth 3 (three) times daily.  Dispense: 30 capsule; Refill: 0  2. Essential hypertension - cloNIDine (CATAPRES) 0.3 MG tablet; Take 1 tablet (0.3 mg total) by mouth 2 (two) times daily.  Dispense: 60 tablet; Refill: 0  3. Pure  hypercholesterolemia - atorvastatin (LIPITOR) 20 MG tablet; Take 20 mg by mouth daily.    Current Outpatient Prescriptions:  .  aspirin EC 81 MG tablet, Take 81 mg by mouth daily., Disp: , Rfl:  .  atorvastatin (LIPITOR) 20 MG tablet, Take 20 mg by mouth daily., Disp: , Rfl:  .  Cholecalciferol (VITAMIN D) 2000 UNITS CAPS, Take 1 capsule by mouth daily., Disp: , Rfl:  .  cloNIDine (CATAPRES) 0.3 MG tablet, Take 1 tablet (0.3 mg total) by mouth 2 (two) times daily., Disp: 60 tablet, Rfl: 0 .  lisinopril (PRINIVIL,ZESTRIL) 40 MG tablet, Take 40 mg by mouth daily., Disp: , Rfl:  .  clindamycin (CLEOCIN) 300 MG capsule, Take 1 capsule (300 mg total) by mouth 3 (three) times daily., Disp: 30 capsule, Rfl: 0 Continue all other maintenance medications as listed above.  Follow up plan: No Follow-up on file.  Educational handout given for [place] PA-C [place] [address]  [place], [place] [address] [phone]   09/04/2016, 8:02 PM

## 2016-09-07 ENCOUNTER — Ambulatory Visit (INDEPENDENT_AMBULATORY_CARE_PROVIDER_SITE_OTHER): Payer: Medicare Other

## 2016-09-07 ENCOUNTER — Ambulatory Visit (INDEPENDENT_AMBULATORY_CARE_PROVIDER_SITE_OTHER): Payer: Medicare Other | Admitting: Family Medicine

## 2016-09-07 ENCOUNTER — Encounter: Payer: Self-pay | Admitting: Family Medicine

## 2016-09-07 VITALS — BP 108/70 | HR 70 | Temp 97.3°F | Ht 71.0 in | Wt 200.0 lb

## 2016-09-07 DIAGNOSIS — M25561 Pain in right knee: Secondary | ICD-10-CM

## 2016-09-07 DIAGNOSIS — L03113 Cellulitis of right upper limb: Secondary | ICD-10-CM | POA: Diagnosis not present

## 2016-09-07 MED ORDER — SULFAMETHOXAZOLE-TRIMETHOPRIM 800-160 MG PO TABS: 1.0000 | ORAL_TABLET | Freq: Two times a day (BID) | ORAL | 0 refills | Status: DC

## 2016-09-07 NOTE — Progress Notes (Signed)
BP 108/70   Pulse 70   Temp (!) 97.3 F (36.3 C) (Oral)   Ht 5\' 11"  (1.803 m)   Wt 200 lb (90.7 kg)   BMI 27.89 kg/m    Subjective:    Patient ID: [name], male    DOB: 12-19-1945, 71 y.o.   MRN: [id number]  HPI: RENEE BEALE is a 71 y.o. male presenting on 09/07/2016 for gradually worsening right knee pain x 4 days that began when he woke up. It is painful when he moves and improved at rest. He has taken hydrocodone/acetominophen without relief. He denies any injury. He was seen here last week after being scratched on the right wrist by his cat, and prescribed clindamycin at that time. This became progressively worse and he was subsequently sent from urgent care to [facility] 5 days ago. In the hospital he was given IV antibiotics and prescribed bactrim and pain medication to take along with his clindamycin. He states that his wrist has not improved and has remained painful, swollen, and draining. He additionally reports pain in his right foot with extension and he has a history of gout. He denies fever, chills, nausea, vomiting, vision changes or swollen lymph nodes.   HPI Relevant past medical, surgical, family and social history reviewed and updated as indicated. Interim medical history since our last visit reviewed. Allergies and medications reviewed and updated.  Review of Systems  Constitutional: Negative for chills, diaphoresis, fatigue and fever.  HENT: Negative for congestion, facial swelling, rhinorrhea, sinus pain, sinus pressure, sneezing and sore throat.   Eyes: Negative for pain and visual disturbance.  Respiratory: Negative for cough, choking, shortness of breath and wheezing.   Cardiovascular: Negative for chest pain, palpitations and leg swelling.  Gastrointestinal: Negative for abdominal pain, constipation, diarrhea, nausea and vomiting.  Musculoskeletal: Positive for arthralgias, back pain (chronic), joint swelling (right wrist), myalgias (chronic) and neck  stiffness (chronic). Negative for neck pain.  Skin: Positive for color change (right wrist) and wound (right wrist).  Neurological: Negative for dizziness, weakness, light-headedness, numbness and headaches.  Psychiatric/Behavioral: Negative for agitation, behavioral problems and confusion.    Per HPI unless specifically indicated above      Objective:    BP 108/70   Pulse 70   Temp (!) 97.3 F (36.3 C) (Oral)   Ht 5\' 11"  (1.803 m)   Wt 200 lb (90.7 kg)   BMI 27.89 kg/m   Wt Readings from Last 3 Encounters:  09/07/16 200 lb (90.7 kg)  09/01/16 210 lb (95.3 kg)  07/19/15 205 lb 9.6 oz (93.3 kg)    Physical Exam  Constitutional: He is oriented to person, place, and time. He appears well-developed and well-nourished. No distress.  HENT:  Head: Normocephalic and atraumatic.  Nose: Nose normal.  Mouth/Throat: Oropharynx is clear and moist. No oropharyngeal exudate.  Eyes: Pupils are equal, round, and reactive to light. EOM are normal. No scleral icterus.  Neck: Normal range of motion. Neck supple. No tracheal deviation present.  Cardiovascular: Normal rate, regular rhythm and intact distal pulses.   No murmur heard. Pulmonary/Chest: Effort normal and breath sounds normal. No respiratory distress. He has no wheezes. He has no rales. He exhibits no tenderness.  Abdominal: Soft. Bowel sounds are normal. He exhibits no distension. There is no tenderness.  Musculoskeletal: He exhibits tenderness. He exhibits no edema or deformity.  Reduced ROM R knee 2/2 pain w/ extension. Negative posterior/anterior drawer. No redness, swelling, or TTP.  Neurological: He  is alert and oriented to person, place, and time. He has normal reflexes.  Skin: Skin is warm and dry. He is not diaphoretic. There is erythema (4cm x 4cm draining pustule surrounding cat scratch on right wrist, ). No pallor.  Psychiatric: He has a normal mood and affect. His behavior is normal. Judgment and thought content normal.    Nursing note and vitals reviewed.       Assessment & Plan:   Problem List Items Addressed This Visit    None    Visit Diagnoses    Right knee pain, unspecified chronicity    -  Primary   Could be gout in his right knee, recommended indomethacin and if not improved will send orthopedic   Relevant Orders   DG Knee 1-2 Views Right (Completed)      DONDRELL LOUDERMILK is a 71 y.o. male presenting on 09/07/2016 for gradually worsening right knee pain x 4 days that began when he woke up. It is painful when he moves and improved at rest. He was seen here last week after being scratched on the right wrist by his cat, then seen again 5 days ago at [facility]. He is taking hydrocodone/acetaminophen, bactrim, and clindamycin. On exam he has tenderness in the posterior aspect of his right knee with extension beyond 30 degrees. I am concerned for tendon vs. joint space pathology. Given the morning onset of pain and his history of gout I counseled him to take his indomethacin, but call back if there is no improvement in 48 hours. Bartonella Henselae is on my differential given his cat scratch and musculoskeletal symptoms, but it is less likely given his lack of other systemic symptoms, lymphadenopathy, ocular changes, or fever. He was given a 7 day course of bactrim which has coverage for bartonella, and given lack of improvement of the pustule on his right wrist I will extend this course by another 7 days. I counseled him to follow up or go to the ED if any of his symptoms acutely worsen or he does not see improvement with indomethacin.   Follow up plan: Return if symptoms worsen or fail to improve.   Counseling provided for all of the vaccine components Orders Placed This Encounter  Procedures  . DG Knee 1-2 Views Right   Patient was seen and examined with Shon Hale medical student. Agree with assessment and plan above. Instructed patient to have a short leash to call us back if things worsen that  he may have to go to the emergency department for receive an orthopedic referral stat. No erythema or warmth in the knee itself Caryl Pina, MD [facility] 09/07/2016, 10:13 AM

## 2016-09-09 DIAGNOSIS — I10 Essential (primary) hypertension: Secondary | ICD-10-CM | POA: Diagnosis not present

## 2016-09-09 DIAGNOSIS — L03113 Cellulitis of right upper limb: Secondary | ICD-10-CM | POA: Insufficient documentation

## 2016-09-09 DIAGNOSIS — E7800 Pure hypercholesterolemia, unspecified: Secondary | ICD-10-CM | POA: Diagnosis not present

## 2016-09-09 DIAGNOSIS — L02414 Cutaneous abscess of left upper limb: Secondary | ICD-10-CM | POA: Diagnosis not present

## 2016-09-09 DIAGNOSIS — L02413 Cutaneous abscess of right upper limb: Secondary | ICD-10-CM | POA: Diagnosis not present

## 2016-09-09 DIAGNOSIS — Z79899 Other long term (current) drug therapy: Secondary | ICD-10-CM | POA: Diagnosis not present

## 2016-09-09 DIAGNOSIS — Z7982 Long term (current) use of aspirin: Secondary | ICD-10-CM | POA: Diagnosis not present

## 2016-09-15 DIAGNOSIS — I10 Essential (primary) hypertension: Secondary | ICD-10-CM | POA: Diagnosis not present

## 2016-09-15 DIAGNOSIS — Z79899 Other long term (current) drug therapy: Secondary | ICD-10-CM | POA: Diagnosis not present

## 2016-09-15 DIAGNOSIS — M79641 Pain in right hand: Secondary | ICD-10-CM | POA: Diagnosis not present

## 2016-09-15 DIAGNOSIS — Z7982 Long term (current) use of aspirin: Secondary | ICD-10-CM | POA: Diagnosis not present

## 2016-09-15 DIAGNOSIS — Z4800 Encounter for change or removal of nonsurgical wound dressing: Secondary | ICD-10-CM | POA: Diagnosis not present

## 2016-09-15 DIAGNOSIS — E7800 Pure hypercholesterolemia, unspecified: Secondary | ICD-10-CM | POA: Diagnosis not present

## 2016-09-15 DIAGNOSIS — L02413 Cutaneous abscess of right upper limb: Secondary | ICD-10-CM | POA: Diagnosis not present

## 2016-11-08 ENCOUNTER — Ambulatory Visit: Payer: Medicare Other | Admitting: *Deleted

## 2017-04-10 ENCOUNTER — Ambulatory Visit (INDEPENDENT_AMBULATORY_CARE_PROVIDER_SITE_OTHER): Payer: Medicare Other | Admitting: *Deleted

## 2017-04-10 ENCOUNTER — Encounter: Payer: Self-pay | Admitting: *Deleted

## 2017-04-10 VITALS — BP 179/100 | HR 69 | Ht 70.0 in | Wt 204.0 lb

## 2017-04-10 DIAGNOSIS — Z0000 Encounter for general adult medical examination without abnormal findings: Secondary | ICD-10-CM | POA: Diagnosis not present

## 2017-04-10 NOTE — Progress Notes (Addendum)
Subjective:   Carl Horne is a 72 y.o. male who presents for an Initial Medicare Annual Wellness Visit. 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 He served in [employer] during the [place] War and is a member of the local [employer]. He goes to the [place] for a lot of his primary care and and procedures. He has two adult daughters, 3 grandchildren, and 1 great grandson. He enjoys playing golf.   Review of Systems  His health is about the same as last year.   Cardiac Risk Factors include: advanced age (>16men, >52 women);dyslipidemia;hypertension;male gender;smoking/ tobacco exposure  Patient denied any headache or vision changes. Declined visit with provider. Takes clonidine 0.3mg  daily.  [facility] Said that his blood pressure fluctuates.    Objective:    Today's Vitals   04/10/17 1628  BP: (!) 179/100  Pulse: 69  Weight: 204 lb (92.5 kg)  Height: 5\' 10"  (1.778 m)   Body mass index is 29.27 kg/m.  Advanced Directives 04/10/2017  Does Patient Have a Medical Advance Directive? No  Would patient like information on creating a medical advance directive? No - Patient declined    Current Medications (verified) Outpatient Encounter Medications as of 04/10/2017  Medication Sig  . aspirin EC 81 MG tablet Take 81 mg by mouth daily.  Marland Kitchen atorvastatin (LIPITOR) 20 MG tablet Take 20 mg by mouth daily.  . Cholecalciferol (VITAMIN D) 2000 UNITS CAPS Take 1 capsule by mouth daily.  . cloNIDine (CATAPRES) 0.3 MG tablet Take 1 tablet (0.3 mg total) by mouth 2 (two) times daily.  Marland Kitchen lisinopril (PRINIVIL,ZESTRIL) 40 MG tablet Take 40 mg by mouth daily.  . clindamycin (CLEOCIN) 300 MG capsule Take 1 capsule (300 mg total) by mouth 3 (three) times daily.  Marland Kitchen sulfamethoxazole-trimethoprim (BACTRIM DS,SEPTRA DS) 800-160 MG tablet Take 1 tablet by mouth 2 (two) times daily.  . [DISCONTINUED] HYDROcodone-acetaminophen (NORCO) 7.5-325 MG tablet Take 1 tablet by  mouth every 6 (six) hours as needed for moderate pain.   No facility-administered encounter medications on file as of 04/10/2017.     Allergies (verified) Patient has no known allergies.   History: Past Medical History:  Diagnosis Date  . Cat scratch fever   . Herniated cervical disc   . Hyperlipidemia   . Hypertension    Past Surgical History:  Procedure Laterality Date  . CHOLECYSTECTOMY    . TONSILLECTOMY     Family History  Problem Relation Age of Onset  . Liver disease Mother   . Diabetes Father   . Heart disease Father   . Healthy Sister   . Obesity Sister   . Diabetes Brother   . Kidney disease Brother   . Healthy Daughter   . Healthy Daughter    Social History   Socioeconomic History  . Marital status: Married    Spouse name: Not on file  . Number of children: 2  . Years of education: 92  . Highest education level: 12th grade  Occupational History  . Occupation: Self Employed    Comment: Air cabin crew  Social Needs  . Financial resource strain: Not hard at all  . Food insecurity:    Worry: Never true    Inability: Never true  . Transportation needs:    Medical: No    Non-medical: No  Tobacco Use  . Smoking status: Current Every Day Smoker    Packs/day: 1.00    Years: 45.00  Pack years: 45.00    Types: Cigars  . Smokeless tobacco: Never Used  Substance and Sexual Activity  . Alcohol use: No  . Drug use: No  . Sexual activity: Yes  Lifestyle  . Physical activity:    Days per week: 3 days    Minutes per session: 20 min  . Stress: Only a little  Relationships  . Social connections:    Talks on phone: More than three times a week    Gets together: More than three times a week    Attends religious service: Never    Active member of club or organization: Yes    Attends meetings of clubs or organizations: More than 4 times per year    Relationship status: Married  Other Topics Concern  . Not on file  Social History Narrative  .  Not on file   Tobacco Counseling Ready to quit: No Counseling given: No   Clinical Intake:  Pre-visit preparation completed: No  Pain : No/denies pain     Nutritional Status: BMI of 19-24  Normal Diabetes: No  How often do you need to have someone help you when you read instructions, pamphlets, or other written materials from your doctor or pharmacy?: 1 - Never What is the last grade level you completed in school?: high school graduate  Interpreter Needed?: No  Information entered by :: Chong Sicilian, RN  Activities of Daily Living In your present state of health, do you have any difficulty performing the following activities: 04/10/2017  Hearing? Y  Vision? Y  Comment had one lens implant and is scheduled for another this week  Difficulty concentrating or making decisions? N  Walking or climbing stairs? N  Dressing or bathing? N  Doing errands, shopping? N  Preparing Food and eating ? N  Using the Toilet? N  In the past six months, have you accidently leaked urine? N  Do you have problems with loss of bowel control? N  Managing your Finances? N  Housekeeping or managing your Housekeeping? N  Some recent data might be hidden     Immunizations and Health Maintenance Immunization History  Administered Date(s) Administered  . Influenza,inj,Quad PF,6+ Mos 12/15/2013   Health Maintenance Due  Topic Date Due  . Hepatitis C Screening  03-18-1945  . TETANUS/TDAP  11/23/1966  . PNA vac Low Risk Adult (1 of 2 - PCV13) 11/22/2012    Patient Care Team: Theodoro Clock as PCP - General (Physician Assistant)  Patient sees the [facility] and had eye surgery done last year. He also sees them for right ear problems.    Assessment:   This is a routine wellness examination for Rayquan.  Hearing/Vision screen Recent eye exam at the [place]  Dietary issues and exercise activities discussed: Current Exercise Habits: The patient does not participate in regular exercise at present,  Exercise limited by: None identified  Goals    . Exercise 150 min/wk Moderate Activity      Depression Screen PHQ 2/9 Scores 04/10/2017 09/07/2016 09/01/2016 07/19/2015  PHQ - 2 Score 0 0 0 0    Fall Risk Fall Risk  04/10/2017 09/07/2016 09/01/2016 07/19/2015 07/15/2015  Falls in the past year? No No No No No    Is the patient's home free of loose throw rugs in walkways, pet beds, electrical cords, etc?   yes      Grab bars in the bathroom? no      Handrails on the stairs?   yes  Adequate lighting?   yes   Cognitive Function: MMSE - Mini Mental State Exam 04/10/2017  Orientation to time 5  Orientation to Place 5  Registration 3  Attention/ Calculation 5  Recall 3  Language- name 2 objects 2  Language- repeat 1  Language- follow 3 step command 3  Language- read & follow direction 0  Write a sentence 1  Copy design 1  Total score 29        Screening Tests Health Maintenance  Topic Date Due  . Hepatitis C Screening  [birth year]  . TETANUS/TDAP  11/23/1966  . PNA vac Low Risk Adult (1 of 2 - PCV13) 11/22/2012  . INFLUENZA VACCINE  09/08/2017 (Originally 08/02/2017)  . COLONOSCOPY  06/10/2018  reports having Tdap and Pneumonia vaccines at the [facility]:  [facility] Recommended follow up on blood pressure ASAP. He insists that it fluctuates and wasn't concerned.   Asked him to go ahead and take his clonidine and to rck his blood pressure. If it remains elevated seek care.  Continue to stay active. Aim for 150 minutes of moderate activity a week. DASH diet Records records signed to obtain immunization Records from the [place]  I have personally reviewed and noted the following in the patient's chart:   . Medical and social history . Use of alcohol, tobacco or illicit drugs  . Current medications and supplements . Functional ability and status . Nutritional status . Physical activity . Advanced directives . List of other physicians . Hospitalizations, surgeries,  and ER visits in previous 12 months . Vitals . Screenings to include cognitive, depression, and falls . Referrals and appointments  In addition, I have reviewed and discussed with patient certain preventive protocols, quality metrics, and best practice recommendations. A written personalized care plan for preventive services as well as general preventive health recommendations were provided to patient.     Carney Bern Trenton, RN   04/10/2017  I have reviewed and agree with the above AWV documentation.   Terald Sleeper PA-C [facility] [address]  [place], [place] [address] [phone]

## 2017-04-10 NOTE — Patient Instructions (Signed)
  Carl Horne , Thank you for taking time to come for your Medicare Wellness Visit. I appreciate your ongoing commitment to your health goals. Please review the following plan we discussed and let me know if I can assist you in the future.   These are the goals we discussed: Goals    . Exercise 150 min/wk Moderate Activity       This is a list of the screening recommended for you and due dates:  Health Maintenance  Topic Date Due  .  Hepatitis C: One time screening is recommended by Center for Disease Control  (CDC) for  adults born from [birth year] through 1963.   05-26-1945  . Tetanus Vaccine  11/23/1966  . Pneumonia vaccines (1 of 2 - PCV13) 11/22/2012  . Flu Shot  09/08/2017*  . Colon Cancer Screening  06/10/2018  *Topic was postponed. The date shown is not the original due date.

## 2017-11-19 DIAGNOSIS — H90A22 Sensorineural hearing loss, unilateral, left ear, with restricted hearing on the contralateral side: Secondary | ICD-10-CM | POA: Diagnosis not present

## 2017-11-19 DIAGNOSIS — Z8669 Personal history of other diseases of the nervous system and sense organs: Secondary | ICD-10-CM | POA: Diagnosis not present

## 2017-11-19 DIAGNOSIS — H9313 Tinnitus, bilateral: Secondary | ICD-10-CM | POA: Diagnosis not present

## 2017-11-19 DIAGNOSIS — H90A31 Mixed conductive and sensorineural hearing loss, unilateral, right ear with restricted hearing on the contralateral side: Secondary | ICD-10-CM | POA: Diagnosis not present

## 2017-11-19 DIAGNOSIS — Z9889 Other specified postprocedural states: Secondary | ICD-10-CM | POA: Diagnosis not present

## 2017-11-19 DIAGNOSIS — H9211 Otorrhea, right ear: Secondary | ICD-10-CM | POA: Diagnosis not present

## 2018-06-19 ENCOUNTER — Telehealth: Payer: Self-pay | Admitting: Physician Assistant

## 2018-06-19 NOTE — Telephone Encounter (Signed)
Spoke to pt and advised he hasn't been seen by a provider here since 2018 and we can't rx anything till he is seen in the office. Pt states he only comes to see [place] when he is sick and for regular things he goes to the [place]. Pt didn't want to call his [place] doctor and have to drive to [place]. Advised pt he would have to call his [place] and get them to write the script and pt voiced understanding.

## 2018-11-20 ENCOUNTER — Encounter: Payer: Self-pay | Admitting: Physician Assistant

## 2018-11-20 ENCOUNTER — Ambulatory Visit (INDEPENDENT_AMBULATORY_CARE_PROVIDER_SITE_OTHER): Payer: Medicare Other | Admitting: Physician Assistant

## 2018-11-20 DIAGNOSIS — M65341 Trigger finger, right ring finger: Secondary | ICD-10-CM

## 2018-11-20 MED ORDER — PREDNISONE 10 MG (21) PO TBPK
ORAL_TABLET | ORAL | 0 refills | Status: DC
Start: 2018-11-20 — End: 2020-06-03

## 2018-11-20 NOTE — Progress Notes (Signed)
      Telephone visit  Subjective: [id number] pain PCP: Terald Sleeper, PA-C [id number] Carl Horne is a 73 y.o. male calls for telephone consult today. Patient provides verbal consent for consult held via phone.  Patient is identified with 2 separate identifiers.  At this time the entire area is on COVID-19 social distancing and stay home orders are in place.  Patient is of higher risk and therefore we are performing this by a virtual method.  Location of patient: home Location of provider: HOME Others present for call: no  Patient is having a phone visit today concerning his fourth finger on the right hand.  He states over the past few weeks it is increasingly becoming more painful and having limited range of motion.  When he closes his hand it will get stuck there and pops back open.  It definitely sounds like a trigger finger is going on.  He does have swollen joints throughout all of his hands.  He does think he has arthritis changes and not a cord.  The patient mainly goes through the [place] system so we do not have any current labs at this time.  And we will treat hi, with prednisone for a short pack, just concerned about NSAIDs at his age.  And a referral will be placed.   ROS: Per HPI  No Known Allergies Past Medical History:  Diagnosis Date  . Cat scratch fever   . Herniated cervical disc   . Hyperlipidemia   . Hypertension     Current Outpatient Medications:  .  aspirin EC 81 MG tablet, Take 81 mg by mouth daily., Disp: , Rfl:  .  atorvastatin (LIPITOR) 20 MG tablet, Take 20 mg by mouth daily., Disp: , Rfl:  .  Cholecalciferol (VITAMIN D) 2000 UNITS CAPS, Take 1 capsule by mouth daily., Disp: , Rfl:  .  cloNIDine (CATAPRES) 0.3 MG tablet, Take 1 tablet (0.3 mg total) by mouth 2 (two) times daily., Disp: 60 tablet, Rfl: 0 .  lisinopril (PRINIVIL,ZESTRIL) 40 MG tablet, Take 40 mg by mouth daily., Disp: , Rfl:  .  predniSONE (STERAPRED UNI-PAK 21 TAB) 10 MG (21) TBPK tablet, As  directed x 6 days, Disp: 21 tablet, Rfl: 0  Assessment/ Plan: 73 y.o. male   1. Trigger ring finger of right hand Sterapred 5 mg 6-day Dosepak - Ambulatory referral to Hand Surgery   No follow-ups on file.  Continue all other maintenance medications as listed above.  Start time: 2:00 PM End time: 2:09 PM  Meds ordered this encounter  Medications  . predniSONE (STERAPRED UNI-PAK 21 TAB) 10 MG (21) TBPK tablet    Sig: As directed x 6 days    Dispense:  21 tablet    Refill:  0    Order Specific Question:   Supervising Provider    Answer:   Janora Norlander [id number]    Particia Nearing PA-C [place] [phone]

## 2019-04-07 DIAGNOSIS — D485 Neoplasm of uncertain behavior of skin: Secondary | ICD-10-CM | POA: Diagnosis not present

## 2019-04-07 DIAGNOSIS — C44311 Basal cell carcinoma of skin of nose: Secondary | ICD-10-CM | POA: Diagnosis not present

## 2019-04-07 DIAGNOSIS — S0001XA Abrasion of scalp, initial encounter: Secondary | ICD-10-CM | POA: Diagnosis not present

## 2019-04-07 DIAGNOSIS — Z85828 Personal history of other malignant neoplasm of skin: Secondary | ICD-10-CM | POA: Diagnosis not present

## 2019-04-07 DIAGNOSIS — L905 Scar conditions and fibrosis of skin: Secondary | ICD-10-CM | POA: Diagnosis not present

## 2019-04-16 DIAGNOSIS — M24541 Contracture, right hand: Secondary | ICD-10-CM | POA: Insufficient documentation

## 2019-04-21 DIAGNOSIS — M25641 Stiffness of right hand, not elsewhere classified: Secondary | ICD-10-CM | POA: Diagnosis not present

## 2019-04-21 DIAGNOSIS — M79644 Pain in right finger(s): Secondary | ICD-10-CM | POA: Diagnosis not present

## 2019-04-21 DIAGNOSIS — M24541 Contracture, right hand: Secondary | ICD-10-CM | POA: Diagnosis not present

## 2019-04-29 DIAGNOSIS — M24541 Contracture, right hand: Secondary | ICD-10-CM | POA: Diagnosis not present

## 2019-04-29 DIAGNOSIS — M25641 Stiffness of right hand, not elsewhere classified: Secondary | ICD-10-CM | POA: Diagnosis not present

## 2019-04-29 DIAGNOSIS — M79644 Pain in right finger(s): Secondary | ICD-10-CM | POA: Diagnosis not present

## 2019-05-08 DIAGNOSIS — M25641 Stiffness of right hand, not elsewhere classified: Secondary | ICD-10-CM | POA: Diagnosis not present

## 2019-05-08 DIAGNOSIS — M24541 Contracture, right hand: Secondary | ICD-10-CM | POA: Diagnosis not present

## 2019-06-06 DIAGNOSIS — C44319 Basal cell carcinoma of skin of other parts of face: Secondary | ICD-10-CM | POA: Diagnosis not present

## 2020-06-03 ENCOUNTER — Other Ambulatory Visit: Payer: Self-pay

## 2020-06-03 ENCOUNTER — Other Ambulatory Visit: Payer: Self-pay | Admitting: Family Medicine

## 2020-06-03 ENCOUNTER — Ambulatory Visit (HOSPITAL_COMMUNITY)
Admission: RE | Admit: 2020-06-03 | Discharge: 2020-06-03 | Disposition: A | Discharge status: Home / Self Care | Payer: Medicare Other | Source: Ambulatory Visit | Attending: Family Medicine | Admitting: Family Medicine

## 2020-06-03 ENCOUNTER — Ambulatory Visit (INDEPENDENT_AMBULATORY_CARE_PROVIDER_SITE_OTHER): Payer: Medicare Other | Admitting: Family Medicine

## 2020-06-03 ENCOUNTER — Encounter: Payer: Self-pay | Admitting: Family Medicine

## 2020-06-03 VITALS — BP 153/81 | HR 60 | Temp 97.1°F | Ht 70.0 in | Wt 196.6 lb

## 2020-06-03 DIAGNOSIS — R221 Localized swelling, mass and lump, neck: Secondary | ICD-10-CM

## 2020-06-03 DIAGNOSIS — K1120 Sialoadenitis, unspecified: Secondary | ICD-10-CM

## 2020-06-03 LAB — CBC WITH DIFFERENTIAL/PLATELET
Basophils Absolute: 0.1 10*3/uL (ref 0.0–0.2)
Basos: 1 %
EOS (ABSOLUTE): 0.3 10*3/uL (ref 0.0–0.4)
Eos: 3 %
Hematocrit: 45.5 % (ref 37.5–51.0)
Hemoglobin: 15.4 g/dL (ref 13.0–17.7)
Immature Grans (Abs): 0 10*3/uL (ref 0.0–0.1)
Immature Granulocytes: 0 %
Lymphocytes Absolute: 1.9 10*3/uL (ref 0.7–3.1)
Lymphs: 21 %
MCH: 31 pg (ref 26.6–33.0)
MCHC: 33.8 g/dL (ref 31.5–35.7)
MCV: 92 fL (ref 79–97)
Monocytes Absolute: 0.9 10*3/uL (ref 0.1–0.9)
Monocytes: 10 %
Neutrophils Absolute: 5.9 10*3/uL (ref 1.4–7.0)
Neutrophils: 65 %
Platelets: 288 10*3/uL (ref 150–450)
RBC: 4.97 x10E6/uL (ref 4.14–5.80)
RDW: 13 % (ref 11.6–15.4)
WBC: 9 10*3/uL (ref 3.4–10.8)

## 2020-06-03 MED ORDER — CEPHALEXIN 500 MG PO CAPS: 500.0000 mg | ORAL_CAPSULE | Freq: Four times a day (QID) | ORAL | 0 refills | Status: AC

## 2020-06-03 NOTE — Progress Notes (Signed)
   Assessment & Plan:  1. Neck mass - US Soft Tissue Head/Neck (NON-THYROID); Future (STAT) - CBC with Differential/Platelet   Follow up plan: Return ASAP, for annual physical.  Hendricks Limes, MSN, APRN, FNP-C [facility]  Subjective:   Patient ID: Carl Horne, male    DOB: 16-Mar-1945, 75 y.o.   MRN: [id number]  HPI: Carl Horne is a 75 y.o. male presenting on 06/03/2020 for knot on neck  (Patient states he has a knot on the right side of his neck x 5 days/)  Patient reports swelling on the right side of his neck that he first noticed 5 days ago. He reports it is always worse in the evening and a little better in the morning. It is tender (more so in the evening). He denies any recent illnesses.    ROS: Negative unless specifically indicated above in HPI.   Relevant past medical history reviewed and updated as indicated.   Allergies and medications reviewed and updated.   Current Outpatient Medications:  .  aspirin EC 81 MG tablet, Take 81 mg by mouth daily., Disp: , Rfl:  .  atorvastatin (LIPITOR) 20 MG tablet, Take 20 mg by mouth daily., Disp: , Rfl:  .  Cholecalciferol (VITAMIN D) 2000 UNITS CAPS, Take 1 capsule by mouth daily., Disp: , Rfl:  .  cloNIDine (CATAPRES) 0.3 MG tablet, Take 1 tablet (0.3 mg total) by mouth 2 (two) times daily., Disp: 60 tablet, Rfl: 0 .  lisinopril (PRINIVIL,ZESTRIL) 40 MG tablet, Take 40 mg by mouth daily., Disp: , Rfl:   No Known Allergies  Objective:   BP (!) 153/81   Pulse 60   Temp (!) 97.1 F (36.2 C) (Temporal)   Ht 5\' 10"  (1.778 m)   Wt 196 lb 9.6 oz (89.2 kg)   SpO2 94%   BMI 28.21 kg/m    Physical Exam Vitals reviewed.  Constitutional:      General: He is not in acute distress.    Appearance: Normal appearance. He is not ill-appearing, toxic-appearing or diaphoretic.  HENT:     Head: Normocephalic and atraumatic.  Eyes:     General: No scleral icterus.       Right eye: No discharge.        Left  eye: No discharge.     Conjunctiva/sclera: Conjunctivae normal.  Neck:      Comments: Right side is mass like and tender. Anterior aspect is mildly erythematous with swelling. Cardiovascular:     Rate and Rhythm: Normal rate.  Pulmonary:     Effort: Pulmonary effort is normal. No respiratory distress.  Musculoskeletal:        General: Normal range of motion.     Cervical back: Normal range of motion.  Skin:    General: Skin is warm and dry.  Neurological:     Mental Status: He is alert and oriented to person, place, and time. Mental status is at baseline.  Psychiatric:        Mood and Affect: Mood normal.        Behavior: Behavior normal.        Thought Content: Thought content normal.        Judgment: Judgment normal.

## 2020-06-24 ENCOUNTER — Ambulatory Visit (INDEPENDENT_AMBULATORY_CARE_PROVIDER_SITE_OTHER): Payer: Medicare Other

## 2020-06-24 VITALS — Ht 70.0 in | Wt 196.0 lb

## 2020-06-24 DIAGNOSIS — Z0000 Encounter for general adult medical examination without abnormal findings: Secondary | ICD-10-CM | POA: Diagnosis not present

## 2020-06-24 DIAGNOSIS — H90A22 Sensorineural hearing loss, unilateral, left ear, with restricted hearing on the contralateral side: Secondary | ICD-10-CM | POA: Insufficient documentation

## 2020-06-24 DIAGNOSIS — H90A31 Mixed conductive and sensorineural hearing loss, unilateral, right ear with restricted hearing on the contralateral side: Secondary | ICD-10-CM | POA: Insufficient documentation

## 2020-06-24 NOTE — Patient Instructions (Signed)
Carl Horne , Thank you for taking time to come for your Medicare Wellness Visit. I appreciate your ongoing commitment to your health goals. Please review the following plan we discussed and let me know if I can assist you in the future.   Screening recommendations/referrals: [facility] Recommended yearly ophthalmology/optometry visit for glaucoma screening and checkup Recommended yearly dental visit for hygiene and checkup  Vaccinations: Influenza vaccine: Done 2021 - Repeat every year Pneumococcal vaccine: Done at [facility] - we need dates Tdap vaccine: Done at [place] - we need date Shingles vaccine: Done at [place] - we need date   Covid-19: Done - we need dates  Advanced directives: Advance directive discussed with you today. Even though you declined this today, please call our office should you change your mind, and we can give you the proper paperwork for you to fill out.  Conditions/risks identified: Aim for 30 minutes of exercise or brisk walking each day, drink 6-8 glasses of water and eat lots of fruits and vegetables.  Next appointment: Follow up in one year for your annual wellness visit.   Preventive Care 73 Years and Older, Male  Preventive care refers to lifestyle choices and visits with your health care provider that can promote health and wellness. What does preventive care include? A yearly physical exam. This is also called an annual well check. Dental exams once or twice a year. Routine eye exams. Ask your health care provider how often you should have your eyes checked. Personal lifestyle choices, including: Daily care of your teeth and gums. Regular physical activity. Eating a healthy diet. Avoiding tobacco and drug use. Limiting alcohol use. Practicing safe sex. Taking low doses of aspirin every day. Taking vitamin and mineral supplements as recommended by your health care provider. What happens during an annual well check? The services and screenings done  by your health care provider during your annual well check will depend on your age, overall health, lifestyle risk factors, and family history of disease. Counseling  Your health care provider may ask you questions about your: Alcohol use. Tobacco use. Drug use. Emotional well-being. Home and relationship well-being. Sexual activity. Eating habits. History of falls. Memory and ability to understand (cognition). Work and work Statistician. Screening  You may have the following tests or measurements: Height, weight, and BMI. Blood pressure. Lipid and cholesterol levels. These may be checked every 5 years, or more frequently if you are over 47 years old. Skin check. Lung cancer screening. You may have this screening every year starting at age 68 if you have a 30-pack-year history of smoking and currently smoke or have quit within the past 15 years. Fecal occult blood test (FOBT) of the stool. You may have this test every year starting at age 56. Flexible sigmoidoscopy or colonoscopy. You may have a sigmoidoscopy every 5 years or a colonoscopy every 10 years starting at age 39. Prostate cancer screening. Recommendations will vary depending on your family history and other risks. Hepatitis C blood test. Hepatitis B blood test. Sexually transmitted disease (STD) testing. Diabetes screening. This is done by checking your blood sugar (glucose) after you have not eaten for a while (fasting). You may have this done every 1-3 years. Abdominal aortic aneurysm (AAA) screening. You may need this if you are a current or former smoker. Osteoporosis. You may be screened starting at age 25 if you are at high risk. Talk with your health care provider about your test results, treatment options, and if  necessary, the need for more tests. Vaccines  Your health care provider may recommend certain vaccines, such as: Influenza vaccine. This is recommended every year. Tetanus, diphtheria, and acellular  pertussis (Tdap, Td) vaccine. You may need a Td booster every 10 years. Zoster vaccine. You may need this after age 40. Pneumococcal 13-valent conjugate (PCV13) vaccine. One dose is recommended after age 21. Pneumococcal polysaccharide (PPSV23) vaccine. One dose is recommended after age 55. Talk to your health care provider about which screenings and vaccines you need and how often you need them. This information is not intended to replace advice given to you by your health care provider. Make sure you discuss any questions you have with your health care provider. Document Released: 01/15/2015 Document Revised: 09/08/2015 Document Reviewed: 10/20/2014 Elsevier Interactive Patient Education  2017 [place] Prevention in the Home Falls can cause injuries. They can happen to people of all ages. There are many things you can do to make your home safe and to help prevent falls. What can I do on the outside of my home? Regularly fix the edges of walkways and driveways and fix any cracks. Remove anything that might make you trip as you walk through a door, such as a raised step or threshold. Trim any bushes or trees on the path to your home. Use bright outdoor lighting. Clear any walking paths of anything that might make someone trip, such as rocks or tools. Regularly check to see if handrails are loose or broken. Make sure that both sides of any steps have handrails. Any raised decks and porches should have guardrails on the edges. Have any leaves, snow, or ice cleared regularly. Use sand or salt on walking paths during winter. Clean up any spills in your garage right away. This includes oil or grease spills. What can I do in the bathroom? Use night lights. Install grab bars by the toilet and in the tub and shower. Do not use towel bars as grab bars. Use non-skid mats or decals in the tub or shower. If you need to sit down in the shower, use a plastic, non-slip stool. Keep the floor  dry. Clean up any water that spills on the floor as soon as it happens. Remove soap buildup in the tub or shower regularly. Attach bath mats securely with double-sided non-slip rug tape. Do not have throw rugs and other things on the floor that can make you trip. What can I do in the bedroom? Use night lights. Make sure that you have a light by your bed that is easy to reach. Do not use any sheets or blankets that are too big for your bed. They should not hang down onto the floor. Have a firm chair that has side arms. You can use this for support while you get dressed. Do not have throw rugs and other things on the floor that can make you trip. What can I do in the kitchen? Clean up any spills right away. Avoid walking on wet floors. Keep items that you use a lot in easy-to-reach places. If you need to reach something above you, use a strong step stool that has a grab bar. Keep electrical cords out of the way. Do not use floor polish or wax that makes floors slippery. If you must use wax, use non-skid floor wax. Do not have throw rugs and other things on the floor that can make you trip. What can I do with my stairs? Do not leave any items  on the stairs. Make sure that there are handrails on both sides of the stairs and use them. Fix handrails that are broken or loose. Make sure that handrails are as long as the stairways. Check any carpeting to make sure that it is firmly attached to the stairs. Fix any carpet that is loose or worn. Avoid having throw rugs at the top or bottom of the stairs. If you do have throw rugs, attach them to the floor with carpet tape. Make sure that you have a light switch at the top of the stairs and the bottom of the stairs. If you do not have them, ask someone to add them for you. What else can I do to help prevent falls? Wear shoes that: Do not have high heels. Have rubber bottoms. Are comfortable and fit you well. Are closed at the toe. Do not wear  sandals. If you use a stepladder: Make sure that it is fully opened. Do not climb a closed stepladder. Make sure that both sides of the stepladder are locked into place. Ask someone to hold it for you, if possible. Clearly mark and make sure that you can see: Any grab bars or handrails. First and last steps. Where the edge of each step is. Use tools that help you move around (mobility aids) if they are needed. These include: Canes. Walkers. Scooters. Crutches. Turn on the lights when you go into a dark area. Replace any light bulbs as soon as they burn out. Set up your furniture so you have a clear path. Avoid moving your furniture around. If any of your floors are uneven, fix them. If there are any pets around you, be aware of where they are. Review your medicines with your doctor. Some medicines can make you feel dizzy. This can increase your chance of falling. Ask your doctor what other things that you can do to help prevent falls. This information is not intended to replace advice given to you by your health care provider. Make sure you discuss any questions you have with your health care provider. Document Released: 10/15/2008 Document Revised: 05/27/2015 Document Reviewed: 01/23/2014 Elsevier Interactive Patient Education  2017 [employer].

## 2020-06-24 NOTE — Progress Notes (Signed)
Subjective:   Carl Horne is a 75 y.o. male who presents for Medicare Annual/Subsequent preventive examination.  Virtual Visit via Telephone Note  I connected with  Carl Horne on 06/24/20 at  4:15 PM EDT by telephone and verified that I am speaking with the correct person using two identifiers.  Location: Patient: Home Provider: [facility] Persons participating in the virtual visit: patient/Nurse Health Advisor   I discussed the limitations, risks, security and privacy concerns of performing an evaluation and management service by telephone and the availability of in person appointments. The patient expressed understanding and agreed to proceed.  Interactive audio and video telecommunications were attempted between this nurse and patient, however failed, due to patient having technical difficulties OR patient did not have access to video capability.  We continued and completed visit with audio only.  Some vital signs may be absent or patient reported.   Catcher Dehoyos E [name], LPN   Review of Systems     Cardiac Risk Factors include: advanced age (>79men, >57 women);smoking/ tobacco exposure;hypertension;dyslipidemia;male gender     Objective:    Today's Vitals   06/24/20 1616  Weight: 196 lb (88.9 kg)  Height: 5\' 10"  (1.778 m)   Body mass index is 28.12 kg/m.  Advanced Directives 06/24/2020 04/10/2017  Does Patient Have a Medical Advance Directive? No No  Would patient like information on creating a medical advance directive? No - Patient declined No - Patient declined    Current Medications (verified) Outpatient Encounter Medications as of 06/24/2020  Medication Sig   aspirin EC 81 MG tablet Take 81 mg by mouth daily.   atorvastatin (LIPITOR) 20 MG tablet Take 20 mg by mouth daily.   Cholecalciferol (VITAMIN D) 2000 UNITS CAPS Take 1 capsule by mouth daily.   cloNIDine (CATAPRES) 0.3 MG tablet Take 1 tablet (0.3 mg total) by mouth 2 (two) times daily.   lisinopril  (PRINIVIL,ZESTRIL) 40 MG tablet Take 40 mg by mouth daily.   No facility-administered encounter medications on file as of 06/24/2020.    Allergies (verified) Patient has no known allergies.   History: Past Medical History:  Diagnosis Date   Cat scratch fever    Herniated cervical disc    Hyperlipidemia    Hypertension    Past Surgical History:  Procedure Laterality Date   CHOLECYSTECTOMY     TONSILLECTOMY     Family History  Problem Relation Age of Onset   Liver disease Mother    Diabetes Father    Heart disease Father    Healthy Sister    Obesity Sister    Diabetes Brother    Kidney disease Brother    Healthy Daughter    Healthy Daughter    Social History   Socioeconomic History   Marital status: Married    Spouse name: Not on file   Number of children: 2   Years of education: 12   Highest education level: 12th grade  Occupational History   Occupation: Self Employed    Comment: Air cabin crew  Tobacco Use   Smoking status: Every Day    Packs/day: 1.00    Years: 45.00    Pack years: 45.00    Types: Cigars, Cigarettes   Smokeless tobacco: Never  Vaping Use   Vaping Use: Never used  Substance and Sexual Activity   Alcohol use: No   Drug use: No   Sexual activity: Yes  Other Topics Concern   Not on file  Social History Narrative   Lives home  with wife - daughter and grandson live with them now.   He goes to the [place] routinely for check ups, eye and ear exams, vaccines and screenings.   Social Determinants of Health   Financial Resource Strain: Low Risk    Difficulty of Paying Living Expenses: Not hard at all  Food Insecurity: No Food Insecurity   Worried About Charity fundraiser in the Last Year: Never true   [place] in the Last Year: Never true  Transportation Needs: No Transportation Needs   Lack of Transportation (Medical): No   Lack of Transportation (Non-Medical): No  Physical Activity: Sufficiently Active   Days of Exercise  per Week: 3 days   Minutes of Exercise per Session: 60 min  Stress: No Stress Concern Present   Feeling of Stress : Not at all  Social Connections: Socially Integrated   Frequency of Communication with Friends and Family: More than three times a week   Frequency of Social Gatherings with Friends and Family: More than three times a week   Attends Religious Services: More than 4 times per year   Active Member of [employer] or Organizations: Yes   Attends Music therapist: More than 4 times per year   Marital Status: Married    Tobacco Counseling Ready to quit: Not Answered Counseling given: Not Answered   Clinical Intake:  Pre-visit preparation completed: Yes  Pain : No/denies pain     BMI - recorded: 28.12 Nutritional Status: BMI 25 -29 Overweight Nutritional Risks: None Diabetes: No  How often do you need to have someone help you when you read instructions, pamphlets, or other written materials from your doctor or pharmacy?: 1 - Never  Diabetic? No  Interpreter Needed?: No  Information entered by :: Marcquis Ridlon, LPN   Activities of Daily Living In your present state of health, do you have any difficulty performing the following activities: 06/24/2020  Hearing? N  Vision? N  Difficulty concentrating or making decisions? N  Walking or climbing stairs? N  Dressing or bathing? N  Doing errands, shopping? N  Preparing Food and eating ? N  Using the Toilet? N  In the past six months, have you accidently leaked urine? N  Do you have problems with loss of bowel control? N  Managing your Medications? N  Managing your Finances? N  Housekeeping or managing your Housekeeping? N  Some recent data might be hidden    Patient Care Team: Loman Brooklyn, FNP as PCP - General (Family Medicine)  Indicate any recent Medical Services you may have received from other than [facility] providers in the past year (date may be approximate).     Assessment:   This is a routine  wellness examination for [place].  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 - up to date with annul audiogram at [facility] - Comments:: Wears eyeglasses - up to date with annual eye exams at [place] issues and exercise activities discussed: Current Exercise Habits: Home exercise routine, Type of exercise: walking, Time (Minutes): 60, Frequency (Times/Week): 3, Weekly Exercise (Minutes/Week): 180, Intensity: Mild, Exercise limited by: None identified   Goals Addressed             This Visit's Progress    Exercise 150 min/wk Moderate Activity   On track      Depression Screen PHQ 2/9 Scores 06/24/2020 06/03/2020 04/10/2017 09/07/2016 09/01/2016 07/19/2015 07/15/2015  PHQ - 2 Score 0 0  0 0 0 0 0  PHQ- 9 Score 3 3 - - - - -    Fall Risk Fall Risk  06/24/2020 06/03/2020 04/10/2017 09/07/2016 09/01/2016  Falls in the past year? 0 0 No No No  Number falls in past yr: 0 - - - -  Injury with Fall? 0 - - - -  Risk for fall due to : Other (Comment) - - - -  Risk for fall due to: Comment intermittent numbness in L leg - - - -  Follow up Falls prevention discussed - - - -    FALL RISK PREVENTION PERTAINING TO THE HOME:  Any stairs in or around the home? Yes  If so, are there any without handrails? No  Home free of loose throw rugs in walkways, pet beds, electrical cords, etc? Yes  Adequate lighting in your home to reduce risk of falls? Yes   ASSISTIVE DEVICES UTILIZED TO PREVENT FALLS:  Life alert? No  Use of a cane, walker or w/c? No  Grab bars in the bathroom? Yes  Shower chair or bench in shower? Yes  Elevated toilet seat or a handicapped toilet? Yes   TIMED UP AND GO:  Was the test performed? No . Telephonic visit.  Cognitive Function: MMSE - Mini Mental State Exam 04/10/2017  Orientation to time 5  Orientation to Place 5  Registration 3  Attention/ Calculation 5  Recall 3  Language- name 2 objects 2  Language- repeat 1   Language- follow 3 step command 3  Language- read & follow direction 0  Write a sentence 1  Copy design 1  Total score 29     6CIT Screen 06/24/2020  What Year? 0 points  What month? 0 points  What time? 0 points  Count back from 20 0 points  Months in reverse 0 points  Repeat phrase 0 points  Total Score 0    Immunizations Immunization History  Administered Date(s) Administered   Fluad Quad(high Dose 65+) 10/09/2018   Influenza,inj,Quad PF,6+ Mos 12/15/2013   Influenza-Unspecified 10/02/2017   Tdap 05/05/2009    TDAP status: Up to date  Flu Vaccine status: Up to date  Pneumococcal vaccine status: Up to date  Covid-19 vaccine status: Completed vaccines  Qualifies for Shingles Vaccine? Yes   Zostavax completed Yes   Shingrix Completed?: Yes  Screening Tests Health Maintenance  Topic Date Due   COVID-19 Vaccine (1) Never done   Hepatitis C Screening  Never done   Zoster Vaccines- Shingrix (1 of 2) Never done   PNA vac Low Risk Adult (1 of 2 - PCV13) Never done   COLONOSCOPY (Pts 45-74yrs Insurance coverage will need to be confirmed)  06/10/2018   TETANUS/TDAP  05/06/2019   INFLUENZA VACCINE  08/02/2020   HPV VACCINES  Aged Out    Health Maintenance  Health Maintenance Due  Topic Date Due   COVID-19 Vaccine (1) Never done   Hepatitis C Screening  Never done   Zoster Vaccines- Shingrix (1 of 2) Never done   PNA vac Low Risk Adult (1 of 2 - PCV13) Never done   COLONOSCOPY (Pts 45-4yrs Insurance coverage will need to be confirmed)  06/10/2018   TETANUS/TDAP  05/06/2019    Colorectal cancer screening: Type of screening: Colonoscopy. Completed 2020. Repeat every 10 years  Lung Cancer Screening: (Low Dose CT Chest recommended if Age 65-80 years, 30 pack-year currently smoking OR have quit w/in 15years.) does qualify.   Lung Cancer Screening Referral:  He  has these every year at [place]  Additional Screening:  Hepatitis C Screening: does qualify; Completed at  [facility]: Recommended annual ophthalmology exams for early detection of glaucoma and other disorders of the eye. Is the patient up to date with their annual eye exam?  Yes  Who is the provider or what is the [facility] of the [facility] the [facility] If pt is not established with a provider, would they like to be referred to a provider to establish care? No .   Dental Screening: Recommended annual dental exams for proper oral hygiene  Community Resource Referral / Chronic Care Management: CRR required this visit?  No   CCM required this visit?  No      Plan:     I have personally reviewed and noted the following in the patient's chart:   Medical and social history Use of alcohol, tobacco or illicit drugs  Current medications and supplements including opioid prescriptions. Patient is not currently taking opioid prescriptions. Functional ability and status Nutritional status Physical activity Advanced directives List of other physicians Hospitalizations, surgeries, and ER visits in previous 12 months Vitals Screenings to include cognitive, depression, and falls Referrals and appointments  In addition, I have reviewed and discussed with patient certain preventive protocols, quality metrics, and best practice recommendations. A written personalized care plan for preventive services as well as general preventive health recommendations were provided to patient.     Sandrea Hammond, LPN   05/03/5866   Nurse Notes: He gets screenings and vaccines at [place] - he is up to date - will try to find records and bring to visit.

## 2020-06-25 ENCOUNTER — Ambulatory Visit (INDEPENDENT_AMBULATORY_CARE_PROVIDER_SITE_OTHER): Payer: Medicare Other | Admitting: Family Medicine

## 2020-06-25 ENCOUNTER — Other Ambulatory Visit: Payer: Self-pay

## 2020-06-25 ENCOUNTER — Encounter: Payer: Self-pay | Admitting: Family Medicine

## 2020-06-25 VITALS — BP 161/85 | HR 67 | Temp 97.8°F | Resp 20 | Ht 70.0 in | Wt 194.0 lb

## 2020-06-25 DIAGNOSIS — I10 Essential (primary) hypertension: Secondary | ICD-10-CM | POA: Diagnosis not present

## 2020-06-25 DIAGNOSIS — E7800 Pure hypercholesterolemia, unspecified: Secondary | ICD-10-CM | POA: Diagnosis not present

## 2020-06-25 DIAGNOSIS — Z0001 Encounter for general adult medical examination with abnormal findings: Secondary | ICD-10-CM | POA: Diagnosis not present

## 2020-06-25 DIAGNOSIS — Z0000 Encounter for general adult medical examination without abnormal findings: Secondary | ICD-10-CM

## 2020-06-25 NOTE — Patient Instructions (Signed)
Preventive Care 73 Years and Older, Male Preventive care refers to lifestyle choices and visits with your health care provider that can promote health and wellness. This includes: A yearly physical exam. This is also called an annual wellness visit. Regular dental and eye exams. Immunizations. Screening for certain conditions. Healthy lifestyle choices, such as: Eating a healthy diet. Getting regular exercise. Not using drugs or products that contain nicotine and tobacco. Limiting alcohol use. What can I expect for my preventive care visit? Physical exam Your health care provider will check your: Height and weight. These may be used to calculate your BMI (body mass index). BMI is a measurement that tells if you are at a healthy weight. Heart rate and blood pressure. Body temperature. Skin for abnormal spots. Counseling Your health care provider may ask you questions about your: Past medical problems. Family's medical history. Alcohol, tobacco, and drug use. Emotional well-being. Home life and relationship well-being. Sexual activity. Diet, exercise, and sleep habits. History of falls. Memory and ability to understand (cognition). Work and work environment. Access to firearms. What immunizations do I need?  Vaccines are usually given at various ages, according to a schedule. Your health care provider will recommend vaccines for you based on your age, medicalhistory, and lifestyle or other factors, such as travel or where you work. What tests do I need? Blood tests Lipid and cholesterol levels. These may be checked every 5 years, or more often depending on your overall health. Hepatitis C test. Hepatitis B test. Screening Lung cancer screening. You may have this screening every year starting at age 55 if you have a 30-pack-year history of smoking and currently smoke or have quit within the past 15 years. Colorectal cancer screening. All adults should have this screening  starting at age 50 and continuing until age 75. Your health care provider may recommend screening at age 45 if you are at increased risk. You will have tests every 1-10 years, depending on your results and the type of screening test. Prostate cancer screening. Recommendations will vary depending on your family history and other risks. Genital exam to check for testicular cancer or hernias. Diabetes screening. This is done by checking your blood sugar (glucose) after you have not eaten for a while (fasting). You may have this done every 1-3 years. Abdominal aortic aneurysm (AAA) screening. You may need this if you are a current or former smoker. STD (sexually transmitted disease) testing, if you are at risk. Follow these instructions at home: Eating and drinking  Eat a diet that includes fresh fruits and vegetables, whole grains, lean protein, and low-fat dairy products. Limit your intake of foods with high amounts of sugar, saturated fats, and salt. Take vitamin and mineral supplements as recommended by your health care provider. Do not drink alcohol if your health care provider tells you not to drink. If you drink alcohol: Limit how much you have to 0-2 drinks a day. Be aware of how much alcohol is in your drink. In the [place] one drink equals one 12 oz bottle of beer (355 mL), one 5 oz glass of wine (148 mL), or one 1 oz glass of hard liquor (44 mL).  Lifestyle Take daily care of your teeth and gums. Brush your teeth every morning and night with fluoride toothpaste. Floss one time each day. Stay active. Exercise for at least 30 minutes 5 or more days each week. Do not use any products that contain nicotine or tobacco, such as cigarettes, e-cigarettes, and chewing tobacco.   If you need help quitting, ask your health care provider. Do not use drugs. If you are sexually active, practice safe sex. Use a condom or other form of protection to prevent STIs (sexually transmitted infections). Talk  with your health care provider about taking a low-dose aspirin or statin. Find healthy ways to cope with stress, such as: Meditation, yoga, or listening to music. Journaling. Talking to a trusted person. Spending time with friends and family. Safety Always wear your seat belt while driving or riding in a vehicle. Do not drive: If you have been drinking alcohol. Do not ride with someone who has been drinking. When you are tired or distracted. While texting. Wear a helmet and other protective equipment during sports activities. If you have firearms in your house, make sure you follow all gun safety procedures. What's next? Visit your health care provider once a year for an annual wellness visit. Ask your health care provider how often you should have your eyes and teeth checked. Stay up to date on all vaccines. This information is not intended to replace advice given to you by your health care provider. Make sure you discuss any questions you have with your healthcare provider. Document Revised: 09/17/2018 Document Reviewed: 12/13/2017 Elsevier Patient Education  2022 Elsevier Inc.  

## 2020-06-25 NOTE — Progress Notes (Signed)
Assessment & Plan:  1. Well adult exam Preventive health education provided.  Patient declined all health maintenance as he reports he does all of this through the [place].  Also declined lab work because he does this through the [place].  2. Essential hypertension Uncontrolled, but patient does not wish for me to make medication changes because it is managed by his cardiologist at the [facility].  Encouraged to schedule an appointment with his cardiologist.  3. Pure hypercholesterolemia Unknown control as this is managed by the [place].  Patient declined lab work.   Follow-up: Return in about 1 year (around 06/25/2021) for annual physical.   Hendricks Limes, MSN, APRN, FNP-C [facility]  Subjective:  Patient ID: Carl Horne, male    DOB: 08/13/1945  Age: 75 y.o. MRN: [id number]  Patient Care Team: Loman Brooklyn, FNP as PCP - General (Family Medicine)   CC:  Chief Complaint  Patient presents with   Medical Management of Chronic Issues    HPI Carl Horne presents for his annual physical.  Occupation: Engineer, maintenance (IT), Marital status: Married, Substance use: None Diet: Regular, Exercise: Golf and yard work Last eye exam: Last year Last dental exam: Does not go Last colonoscopy: Unsure, but believes it was within the last 10 years at the [facility] with low-dose Chest CT: Patient states he had this completed at the [place] AAA Screening: Patient states he had this completed at the [place] Hepatitis C Screening: Unknown PSA: Unknown Immunizations: Flu Vaccine:  not flu season Tdap Vaccine:  Patient reports he had this completed at the [place]   Shingrix Vaccine:  Patient reports he had this completed at the [place]   COVID-19 Vaccine:  Patient reports he had this completed at the [place] Pneumonia Vaccine:  Patient reports he had this completed at the [place] Patient does not have advanced directives including DNR, living will, healthcare power of attorney,  and financial power of attorney.   DEPRESSION SCREENING PHQ 2/9 Scores 06/25/2020 06/24/2020 06/03/2020 04/10/2017 09/07/2016 09/01/2016 07/19/2015  PHQ - 2 Score 0 0 0 0 0 0 0  PHQ- 9 Score 3 3 3  - - - -     Review of Systems  Constitutional:  Negative for chills, fever, malaise/fatigue and weight loss.  HENT:  Negative for congestion, ear discharge, ear pain, nosebleeds, sinus pain, sore throat and tinnitus.   Eyes:  Negative for blurred vision, double vision, pain, discharge and redness.  Respiratory:  Negative for cough, shortness of breath and wheezing.   Cardiovascular:  Negative for chest pain, palpitations and leg swelling.  Gastrointestinal:  Negative for abdominal pain, constipation, diarrhea, heartburn, nausea and vomiting.  Genitourinary:  Negative for dysuria, frequency and urgency.  Musculoskeletal:  Negative for myalgias.  Skin:  Negative for rash.  Neurological:  Negative for dizziness, seizures, weakness and headaches.  Psychiatric/Behavioral:  Negative for depression, substance abuse and suicidal ideas. The patient is not nervous/anxious.     Current Outpatient Medications:    aspirin EC 81 MG tablet, Take 81 mg by mouth daily., Disp: , Rfl:    atorvastatin (LIPITOR) 40 MG tablet, Take 40 mg by mouth daily., Disp: , Rfl:    Cholecalciferol (VITAMIN D) 2000 UNITS CAPS, Take 1 capsule by mouth daily., Disp: , Rfl:    cloNIDine (CATAPRES) 0.3 MG tablet, Take 1 tablet (0.3 mg total) by mouth 2 (two) times daily. (Patient taking differently: Take 0.6 mg by mouth 2 (two) times daily.),  Disp: 60 tablet, Rfl: 0   lisinopril (PRINIVIL,ZESTRIL) 40 MG tablet, Take 40 mg by mouth daily., Disp: , Rfl:   No Known Allergies  Past Medical History:  Diagnosis Date   Cat scratch fever    Herniated cervical disc    Hyperlipidemia    Hypertension     Past Surgical History:  Procedure Laterality Date   CHOLECYSTECTOMY     TONSILLECTOMY      Family History  Problem Relation Age of  Onset   Liver disease Mother    Diabetes Father    Heart disease Father    Healthy Sister    Obesity Sister    Diabetes Brother    Kidney disease Brother    Healthy Daughter    Healthy Daughter     Social History   Socioeconomic History   Marital status: Married    Spouse name: Not on file   Number of children: 2   Years of education: 12   Highest education level: 12th grade  Occupational History   Occupation: Self Employed    Comment: Air cabin crew  Tobacco Use   Smoking status: Every Day    Packs/day: 1.00    Years: 45.00    Pack years: 45.00    Types: Cigars, Cigarettes   Smokeless tobacco: Never  Vaping Use   Vaping Use: Never used  Substance and Sexual Activity   Alcohol use: No   Drug use: No   Sexual activity: Yes  Other Topics Concern   Not on file  Social History Narrative   Lives home with wife - daughter and grandson live with them now.   He goes to the [place] routinely for check ups, eye and ear exams, vaccines and screenings.   Social Determinants of Health   Financial Resource Strain: Low Risk    Difficulty of Paying Living Expenses: Not hard at all  Food Insecurity: No Food Insecurity   Worried About Charity fundraiser in the Last Year: Never true   [place] in the Last Year: Never true  Transportation Needs: No Transportation Needs   Lack of Transportation (Medical): No   Lack of Transportation (Non-Medical): No  Physical Activity: Sufficiently Active   Days of Exercise per Week: 3 days   Minutes of Exercise per Session: 60 min  Stress: No Stress Concern Present   Feeling of Stress : Not at all  Social Connections: Socially Integrated   Frequency of Communication with Friends and Family: More than three times a week   Frequency of Social Gatherings with Friends and Family: More than three times a week   Attends Religious Services: More than 4 times per year   Active Member of [employer] or Organizations: Yes   Attends Programme researcher, broadcasting/film/video: More than 4 times per year   Marital Status: Married  Human resources officer Violence: Not At Risk   Fear of Current or Ex-Partner: No   Emotionally Abused: No   Physically Abused: No   Sexually Abused: No      Objective:    BP (!) 161/85   Pulse 67   Temp 97.8 F (36.6 C) (Temporal)   Resp 20   Ht 5' 10"  (1.778 m)   Wt 194 lb (88 kg)   SpO2 95%   BMI 27.84 kg/m   Wt Readings from Last 3 Encounters:  06/25/20 194 lb (88 kg)  06/24/20 196 lb (88.9 kg)  06/03/20 196 lb 9.6 oz (89.2 kg)  Physical Exam Vitals reviewed.  Constitutional:      General: He is not in acute distress.    Appearance: Normal appearance. He is overweight. He is not ill-appearing, toxic-appearing or diaphoretic.  HENT:     Head: Normocephalic and atraumatic.     Right Ear: Tympanic membrane, ear canal and external ear normal. There is no impacted cerumen.     Left Ear: Tympanic membrane, ear canal and external ear normal. There is no impacted cerumen.     Nose: Nose normal. No congestion or rhinorrhea.     Mouth/Throat:     Mouth: Mucous membranes are moist.     Pharynx: Oropharynx is clear. No oropharyngeal exudate or posterior oropharyngeal erythema.  Eyes:     General: No scleral icterus.       Right eye: No discharge.        Left eye: No discharge.     Conjunctiva/sclera: Conjunctivae normal.     Pupils: Pupils are equal, round, and reactive to light.  Neck:     Vascular: No carotid bruit.  Cardiovascular:     Rate and Rhythm: Normal rate and regular rhythm.     Heart sounds: Normal heart sounds. No murmur heard.   No friction rub. No gallop.  Pulmonary:     Effort: Pulmonary effort is normal. No respiratory distress.     Breath sounds: Normal breath sounds. No stridor. No wheezing, rhonchi or rales.  Abdominal:     General: Abdomen is flat. Bowel sounds are normal. There is no distension.     Palpations: Abdomen is soft. There is no hepatomegaly, splenomegaly or  mass.     Tenderness: There is no abdominal tenderness. There is no guarding or rebound.     Hernia: No hernia is present.  Musculoskeletal:        General: Normal range of motion.     Cervical back: Normal range of motion and neck supple. No rigidity. No muscular tenderness.     Right lower leg: No edema.     Left lower leg: No edema.  Lymphadenopathy:     Cervical: No cervical adenopathy.  Skin:    General: Skin is warm and dry.     Capillary Refill: Capillary refill takes less than 2 seconds.  Neurological:     General: No focal deficit present.     Mental Status: He is alert and oriented to person, place, and time. Mental status is at baseline.  Psychiatric:        Mood and Affect: Mood normal.        Behavior: Behavior normal.        Thought Content: Thought content normal.        Judgment: Judgment normal.    No results found for: TSH Lab Results  Component Value Date   WBC 9.0 06/03/2020   HGB 15.4 06/03/2020   HCT 45.5 06/03/2020   MCV 92 06/03/2020   PLT 288 06/03/2020   No results found for: NA, K, CHLORIDE, CO2, GLUCOSE, BUN, CREATININE, BILITOT, ALKPHOS, AST, ALT, PROT, ALBUMIN, CALCIUM, ANIONGAP, EGFR, GFR No results found for: CHOL No results found for: HDL No results found for: LDLCALC No results found for: TRIG No results found for: CHOLHDL No results found for: HGBA1C

## 2021-03-21 DIAGNOSIS — I10 Essential (primary) hypertension: Secondary | ICD-10-CM | POA: Diagnosis not present

## 2021-03-21 DIAGNOSIS — Z720 Tobacco use: Secondary | ICD-10-CM | POA: Diagnosis not present

## 2021-03-21 DIAGNOSIS — E785 Hyperlipidemia, unspecified: Secondary | ICD-10-CM | POA: Diagnosis not present

## 2021-03-21 DIAGNOSIS — F17210 Nicotine dependence, cigarettes, uncomplicated: Secondary | ICD-10-CM | POA: Diagnosis not present

## 2021-06-27 ENCOUNTER — Ambulatory Visit (INDEPENDENT_AMBULATORY_CARE_PROVIDER_SITE_OTHER): Payer: Medicare PPO

## 2021-06-27 VITALS — Wt 196.0 lb

## 2021-06-27 DIAGNOSIS — Z0000 Encounter for general adult medical examination without abnormal findings: Secondary | ICD-10-CM | POA: Diagnosis not present

## 2021-06-27 NOTE — Progress Notes (Signed)
Subjective:   Carl Horne is a 76 y.o. male who presents for Medicare Annual/Subsequent preventive examination.  Virtual Visit via Telephone Note  I connected with  Carl Horne on 06/27/21 at  2:45 PM EDT by telephone and verified that I am speaking with the correct person using two identifiers.  Location: Patient: Home Provider: [facility] Persons participating in the virtual visit: patient/Nurse Health Advisor   I discussed the limitations, risks, security and privacy concerns of performing an evaluation and management service by telephone and the availability of in person appointments. The patient expressed understanding and agreed to proceed.  Interactive audio and video telecommunications were attempted between this nurse and patient, however failed, due to patient having technical difficulties OR patient did not have access to video capability.  We continued and completed visit with audio only.  Some vital signs may be absent or patient reported.   Carl Dexter E Jaylie Neaves, LPN   Review of Systems     Cardiac Risk Factors include: advanced age (>26mn, >>51women);smoking/ tobacco exposure;male gender;dyslipidemia;hypertension     Objective:    Today's Vitals   06/27/21 1439  Weight: 196 lb (88.9 kg)   Body mass index is 28.12 kg/m.     06/27/2021    2:43 PM 06/24/2020    4:22 PM 04/10/2017    4:00 PM  Advanced Directives  Does Patient Have a Medical Advance Directive? No No No  Would patient like information on creating a medical advance directive? No - Patient declined No - Patient declined No - Patient declined    Current Medications (verified) Outpatient Encounter Medications as of 06/27/2021  Medication Sig   aspirin EC 81 MG tablet Take 81 mg by mouth daily.   atorvastatin (LIPITOR) 40 MG tablet Take 40 mg by mouth daily.   Cholecalciferol (VITAMIN D) 2000 UNITS CAPS Take 1 capsule by mouth daily.   cloNIDine (CATAPRES) 0.3 MG tablet Take 1 tablet (0.3 mg total) by mouth 2  (two) times daily. (Patient taking differently: Take 0.6 mg by mouth 2 (two) times daily.)   lisinopril (PRINIVIL,ZESTRIL) 40 MG tablet Take 40 mg by mouth daily.   No facility-administered encounter medications on file as of 06/27/2021.    Allergies (verified) Patient has no known allergies.   History: Past Medical History:  Diagnosis Date   Cat scratch fever    Herniated cervical disc    Hyperlipidemia    Hypertension    Past Surgical History:  Procedure Laterality Date   CHOLECYSTECTOMY     TONSILLECTOMY     Family History  Problem Relation Age of Onset   Liver disease Mother    Diabetes Father    Heart disease Father    Healthy Sister    Obesity Sister    Diabetes Brother    Kidney disease Brother    Healthy Daughter    Healthy Daughter    Social History   Socioeconomic History   Marital status: Married    Spouse name: DTommi Emery  Number of children: 2   Years of education: 12   Highest education level: 12th grade  Occupational History   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   He goes to the [place] routinely for check ups, eye and ear exams, vaccines and screenings.   Social Determinants of Health   Financial Resource Strain: Low Risk  (06/27/2021)   Overall Financial Resource Strain (CARDIA)    Difficulty of Paying Living Expenses: Not hard at all  Food Insecurity: No Food Insecurity (06/27/2021)   Hunger Vital Sign    Worried About Running Out of Food in the Last Year: Never true    Ran Out of Food in the Last Year: Never  true  Transportation Needs: No Transportation Needs (06/27/2021)   PRAPARE - Hydrologist (Medical): No    Lack of Transportation (Non-Medical): No  Physical Activity: Sufficiently Active (06/27/2021)   Exercise Vital Sign    Days of Exercise per Week: 3 days    Minutes of Exercise per Session: 60 min  Stress: No Stress Concern Present (06/27/2021)   [place]    Feeling of Stress : Not at all  Social Connections: Moderately Isolated (06/27/2021)   Social Connection and Isolation Panel [NHANES]    Frequency of Communication with Friends and Family: More than three times a week    Frequency of Social Gatherings with Friends and Family: More than three times a week    Attends Religious Services: Never    Marine scientist or Organizations: No    Attends Music therapist: Never    Marital Status: Married    Tobacco Counseling Ready to quit: Not Answered Counseling given: Not Answered   Clinical Intake:  Pre-visit preparation completed: Yes  Pain : No/denies pain     BMI - recorded: 28.12 Nutritional Status: BMI 25 -29 Overweight Nutritional Risks: None Diabetes: No  How often do you need to have someone help you when you read instructions, pamphlets, or other written materials from your doctor or pharmacy?: 1 - Never  Diabetic? no  Interpreter Needed?: No  Information entered by :: Kaori Jumper, LPN   Activities of Daily Living    06/27/2021    2:43 PM  In your present state of health, do you have any difficulty performing the following activities:  Hearing? 0  Vision? 0  Difficulty concentrating or making decisions? 0  Walking or climbing stairs? 0  Dressing or bathing? 0  Doing errands, shopping? 0  Preparing Food and eating ? N  Using the Toilet? N  In the past six months, have you accidently leaked urine? N  Do you have problems with loss of bowel  control? N  Managing your Medications? N  Managing your Finances? N  Housekeeping or managing your Housekeeping? N    Patient Care Team: Loman Brooklyn, FNP as PCP - General (Family Medicine)  Indicate any recent Medical Services you may have received from other than [facility] providers in the past year (date may be approximate).     Assessment:   This is a routine wellness examination for Kaye.  Hearing/Vision screen Hearing Screening - Comments:: Denies hearing difficulties   Vision Screening - Comments:: Wears rx glasses - [facility]  Dietary issues and exercise activities discussed: Current Exercise Habits: Home exercise routine, Type  of exercise: walking;Other - see comments (golfing, working in the yard), Time (Minutes): 60, Frequency (Times/Week): 3, Weekly Exercise (Minutes/Week): 180, Intensity: Mild, Exercise limited by: orthopedic condition(s)   Goals Addressed             This Visit's Progress    Exercise 150 min/wk Moderate Activity   On track      Depression Screen    06/27/2021    2:43 PM 06/25/2020    2:48 PM 06/24/2020    4:20 PM 06/03/2020    8:58 AM 04/10/2017    4:01 PM 09/07/2016    9:04 AM 09/01/2016    8:30 AM  PHQ 2/9 Scores  PHQ - 2 Score 0 0 0 0 0 0 0  PHQ- 9 Score  '3 3 3       '$ Fall Risk    06/27/2021    2:41 PM 06/25/2020    2:48 PM 06/24/2020    4:32 PM 06/03/2020    8:58 AM 04/10/2017    4:01 PM  Fall Risk   Falls in the past year? 0 0 0 0 No  Number falls in past yr: 0  0    Injury with Fall? 0  0    Risk for fall due to : Orthopedic patient  Other (Comment)    Risk for fall due to: Comment   intermittent numbness in L leg    Follow up Falls prevention discussed Falls evaluation completed Falls prevention discussed      [place]:  Any stairs in or around the home? No  If so, are there any without handrails? No  Home free of loose throw rugs in walkways, pet beds,  electrical cords, etc? Yes  Adequate lighting in your home to reduce risk of falls? Yes   ASSISTIVE DEVICES UTILIZED TO PREVENT FALLS:  Life alert? No  Use of a cane, walker or w/c? No  Grab bars in the bathroom? Yes  Shower chair or bench in shower? Yes  Elevated toilet seat or a handicapped toilet? Yes   TIMED UP AND GO:  Was the test performed? No . Telephonic visit  Cognitive Function:    04/10/2017    4:12 PM  MMSE - Mini Mental State Exam  Orientation to time 5  Orientation to Place 5  Registration 3  Attention/ Calculation 5  Recall 3  Language- name 2 objects 2  Language- repeat 1  Language- follow 3 step command 3  Language- read & follow direction 0  Write a sentence 1  Copy design 1  Total score 29        06/27/2021    2:44 PM 06/24/2020    4:29 PM  6CIT Screen  What Year? 0 points 0 points  What month? 0 points 0 points  What time? 0 points 0 points  Count back from 20 0 points 0 points  Months in reverse 0 points 0 points  Repeat phrase 0 points 0 points  Total Score 0 points 0 points    Immunizations Immunization History  Administered Date(s) Administered   Fluad Quad(high Dose 65+) 10/09/2018   Influenza,inj,Quad PF,6+ Mos 12/15/2013   Influenza-Unspecified 10/02/2017, 10/06/2020   Tdap 03/16/2021    TDAP status: Up to date  Flu Vaccine status: Up to date  Pneumococcal vaccine status: Up to date  Covid-19 vaccine status: Completed vaccines  Qualifies for Shingles Vaccine? Yes   Zostavax completed Yes   Shingrix Completed?: No.  Education has been provided regarding the importance of this vaccine. Patient has been advised to call insurance company to determine out of pocket expense if they have not yet received this vaccine. Advised may also receive vaccine at local pharmacy or Health Dept. Verbalized acceptance and understanding.  Screening Tests Health Maintenance  Topic Date Due   COVID-19 Vaccine (1) Never done   Pneumonia  Vaccine 52+ Years old (1 - PCV) Never done   Hepatitis C Screening  Never done   Zoster Vaccines- Shingrix (1 of 2) Never done   COLONOSCOPY (Pts 45-82yr Insurance coverage will need to be confirmed)  06/10/2018   INFLUENZA VACCINE  08/02/2021   TETANUS/TDAP  03/17/2031   HPV VACCINES  Aged Out    Health Maintenance  Health Maintenance Due  Topic Date Due   COVID-19 Vaccine (1) Never done   Pneumonia Vaccine 74 Years old (1 - PCV) Never done   Hepatitis C Screening  Never done   Zoster Vaccines- Shingrix (1 of 2) Never done   COLONOSCOPY (Pts 45-465yrInsurance coverage will need to be confirmed)  06/10/2018    Colorectal cancer screening: Type of screening: Colonoscopy. Completed 06/09/2008. Repeat every 10 years Thinks he had this more recently - will check and let us[place]oreaonw  Lung Cancer Screening: (Low Dose CT Chest recommended if Age 74-80ears, 30 pack-year currently smoking OR have quit w/in 15years.) does qualify.   Lung Cancer Screening Referral: [facility]  Additional Screening:  Hepatitis C Screening: does qualify; [facility] for early detection of glaucoma and other disorders of the eye. Is the patient up to date with their annual eye exam?  Yes  Who is the provider or what is the name of the office in which the patient attends annual eye exams? [facility] [place] with a provider, would they like to be referred to a provider to establish care? No .   Dental Screening: Recommended annual dental exams for proper oral hygiene  Community Resource Referral / Chronic Care Management: CRR required this visit?  No   CCM required this visit?  No      Plan:     I have personally reviewed and noted the following in the patient's chart:   Medical and social history Use of alcohol, tobacco or illicit drugs  Current medications and supplements including opioid  prescriptions. Patient is not currently taking opioid prescriptions. Functional ability and status Nutritional status Physical activity Advanced directives List of other physicians Hospitalizations, surgeries, and ER visits in previous 12 months Vitals Screenings to include cognitive, depression, and falls Referrals and appointments  In addition, I have reviewed and discussed with patient certain preventive protocols, quality metrics, and best practice recommendations. A written personalized care plan for preventive services as well as general preventive health recommendations were provided to patient.     AmSandrea HammondLPN   06/03/21/3612 Nurse Notes: None - [facility] patient try to get these records so we can scan in chart

## 2021-09-14 ENCOUNTER — Encounter: Payer: Medicare PPO | Admitting: Family Medicine

## 2021-09-22 ENCOUNTER — Ambulatory Visit (INDEPENDENT_AMBULATORY_CARE_PROVIDER_SITE_OTHER): Payer: Medicare PPO | Admitting: Family Medicine

## 2021-09-22 ENCOUNTER — Encounter: Payer: Self-pay | Admitting: Family Medicine

## 2021-09-22 VITALS — BP 138/74 | HR 70 | Temp 98.4°F | Ht 70.0 in | Wt 193.0 lb

## 2021-09-22 DIAGNOSIS — Z23 Encounter for immunization: Secondary | ICD-10-CM | POA: Diagnosis not present

## 2021-09-22 DIAGNOSIS — Z0000 Encounter for general adult medical examination without abnormal findings: Secondary | ICD-10-CM | POA: Diagnosis not present

## 2021-09-22 NOTE — Progress Notes (Signed)
Carl Horne is a 76 y.o. male presents to office today for annual physical exam examination.    Concerns today include: 1. none  Occupation: retired Nature conservation officer Marital status: M Substance use: denies Diet: eats a variety, not strice Exercise: not regular Last eye exam: yearly Last dental exam: dentures Last colonoscopy: 2010, does not wish to continue with screening Immunizations needed:  Flu Vaccine: yes, given  Tdap Vaccine: no   Social History   Socioeconomic History   Marital status: Married    Spouse name: Carl Horne   Number of children: 2   Years of education: 12   Highest education level: 12th grade  Occupational History   Occupation: Self Employed    Comment: Air cabin crew  Tobacco Use   Smoking status: Every Day    Packs/day: 1.00    Years: 45.00    Total pack years: 45.00    Types: Cigars, Cigarettes   Smokeless tobacco: Never  Vaping Use   Vaping Use: Never used  Substance and Sexual Activity   Alcohol use: No   Drug use: No   Sexual activity: Yes  Other Topics Concern   Not on file  Social History Narrative   Lives home with wife - daughter and grandson live with them now.   He goes to the [place] routinely for check ups, eye and ear exams, vaccines and screenings.   Social Determinants of Health   Financial Resource Strain: Low Risk  (06/27/2021)   Overall Financial Resource Strain (CARDIA)    Difficulty of Paying Living Expenses: Not hard at all  Food Insecurity: No Food Insecurity (06/27/2021)   Hunger Vital Sign    Worried About Running Out of Food in the Last Year: Never true    Ran Out of Food in the Last Year: Never true  Transportation Needs: No Transportation Needs (06/27/2021)   PRAPARE - Hydrologist (Medical): No    Lack of Transportation (Non-Medical): No  Physical Activity: Sufficiently Active (06/27/2021)   Exercise Vital Sign    Days of Exercise per Week: 3 days    Minutes of Exercise per Session: 60  min  Stress: No Stress Concern Present (06/27/2021)   [place]    Feeling of Stress : Not at all  Social Connections: Moderately Isolated (06/27/2021)   Social Connection and Isolation Panel [NHANES]    Frequency of Communication with Friends and Family: More than three times a week    Frequency of Social Gatherings with Friends and Family: More than three times a week    Attends Religious Services: Never    Marine scientist or Organizations: No    Attends Archivist Meetings: Never    Marital Status: Married  Human resources officer Violence: Not At Risk (06/27/2021)   Humiliation, Afraid, Rape, and Kick questionnaire    Fear of Current or Ex-Partner: No    Emotionally Abused: No    Physically Abused: No    Sexually Abused: No   Past Surgical History:  Procedure Laterality Date   CHOLECYSTECTOMY     TONSILLECTOMY     Family History  Problem Relation Age of Onset   Liver disease Mother    Diabetes Father    Heart disease Father    Healthy Sister    Obesity Sister    Diabetes Brother    Kidney disease Brother    Healthy Daughter    Healthy Daughter  Current Outpatient Medications:    aspirin EC 81 MG tablet, Take 81 mg by mouth daily., Disp: , Rfl:    atorvastatin (LIPITOR) 40 MG tablet, Take 40 mg by mouth daily., Disp: , Rfl:    Cholecalciferol (VITAMIN D) 2000 UNITS CAPS, Take 1 capsule by mouth daily., Disp: , Rfl:    cloNIDine (CATAPRES) 0.3 MG tablet, Take 1 tablet (0.3 mg total) by mouth 2 (two) times daily. (Patient taking differently: Take 0.6 mg by mouth 2 (two) times daily.), Disp: 60 tablet, Rfl: 0   lisinopril (PRINIVIL,ZESTRIL) 40 MG tablet, Take 40 mg by mouth daily., Disp: , Rfl:   No Known Allergies    Review of Systems Constitutional: negative Eyes: negative Ears, nose, mouth, throat, and face: positive for hearing loss Respiratory: negative Cardiovascular:  negative Gastrointestinal: positive for mild constipation at times Genitourinary:negative Integument/breast: negative Hematologic/lymphatic: negative Musculoskeletal:negative Neurological: positive for left foot drop Behavioral/Psych: sleep disturbance Endocrine: negative Allergic/Immunologic: negative    Physical exam BP 138/74   Pulse 70   Temp 98.4 F (36.9 C)   Ht '5\' 10"'$  (1.778 m)   Wt 193 lb (87.5 kg)   SpO2 94%   BMI 27.69 kg/m  General appearance: alert, cooperative, appears stated age, and no distress Head: Normocephalic, without obvious abnormality, atraumatic Eyes: conjunctivae/corneas clear. PERRL, EOM's intact. Fundi benign. Ears: normal TM's and external ear canals both ears and decreased hearing in right ear Nose: Nares normal. Septum midline. Mucosa normal. No drainage or sinus tenderness. Throat: normal findings: lips normal without lesions, buccal mucosa normal, gums healthy, palate normal, tongue midline and normal, soft palate, uvula, and tonsils normal, and oropharynx pink & moist without lesions or evidence of thrush and abnormal findings: dentition: upper and lower dentures Neck: no adenopathy, no carotid bruit, no JVD, supple, symmetrical, trachea midline, and thyroid not enlarged, symmetric, no tenderness/mass/nodules Back: negative, symmetric, no curvature. ROM normal. No CVA tenderness. Lungs: clear to auscultation bilaterally and normal percussion bilaterally Chest wall: no tenderness Heart: normal apical impulse, regular rate and rhythm, and systolic murmur: holosystolic 3/6, decrescendo throughout the precordium, loudest at apex Abdomen: soft, non-tender; bowel sounds normal; no masses,  no organomegaly Male genitalia: normal, deferred Rectal: deferred Extremities: extremities normal, atraumatic, no cyanosis or edema and slight left foot drop Pulses: 2+ and symmetric Skin: Skin color, texture, turgor normal. No rashes or lesions Lymph nodes:  Cervical, supraclavicular, and axillary nodes normal. Neurologic: Gait: Drop-foot    Assessment/ Plan: Carl Horne here for annual physical exam.   Carl Horne was seen today for annual exam.  Diagnoses and all orders for this visit:  Annual physical exam Records from [facility] requested as pt has all labs, medication management, and health maintenance completed there. Health maintenance discussed in detail.   Need for influenza vaccination Updated today.      Counseled on healthy lifestyle choices, including diet (rich in fruits, vegetables and lean meats and low in salt and simple carbohydrates) and exercise (at least 30 minutes of moderate physical activity daily).  Patient to follow up in 1 year for annual exam or sooner if needed.  The above assessment and management plan was discussed with the patient. The patient verbalized understanding of and has agreed to the management plan. Patient is aware to call the clinic if symptoms persist or worsen. Patient is aware when to return to the clinic for a follow-up visit. Patient educated on when it is appropriate to go to the emergency department.   Monia Pouch, FNP-C  [facility] [address] [place], [place] [address] [phone]

## 2021-09-22 NOTE — Patient Instructions (Addendum)
Melatonin 10 mg nightly with tylenol PM

## 2022-03-25 DIAGNOSIS — W1839XA Other fall on same level, initial encounter: Secondary | ICD-10-CM | POA: Diagnosis not present

## 2022-03-25 DIAGNOSIS — E785 Hyperlipidemia, unspecified: Secondary | ICD-10-CM | POA: Diagnosis not present

## 2022-03-25 DIAGNOSIS — R0602 Shortness of breath: Secondary | ICD-10-CM | POA: Diagnosis not present

## 2022-03-25 DIAGNOSIS — I491 Atrial premature depolarization: Secondary | ICD-10-CM | POA: Diagnosis not present

## 2022-03-25 DIAGNOSIS — G4089 Other seizures: Secondary | ICD-10-CM | POA: Diagnosis not present

## 2022-03-25 DIAGNOSIS — F17210 Nicotine dependence, cigarettes, uncomplicated: Secondary | ICD-10-CM | POA: Diagnosis not present

## 2022-03-25 DIAGNOSIS — R9431 Abnormal electrocardiogram [ECG] [EKG]: Secondary | ICD-10-CM | POA: Diagnosis not present

## 2022-03-25 DIAGNOSIS — G40909 Epilepsy, unspecified, not intractable, without status epilepticus: Secondary | ICD-10-CM | POA: Diagnosis not present

## 2022-03-25 DIAGNOSIS — I10 Essential (primary) hypertension: Secondary | ICD-10-CM | POA: Diagnosis not present

## 2022-03-25 DIAGNOSIS — R030 Elevated blood-pressure reading, without diagnosis of hypertension: Secondary | ICD-10-CM | POA: Diagnosis not present

## 2022-03-25 DIAGNOSIS — F17290 Nicotine dependence, other tobacco product, uncomplicated: Secondary | ICD-10-CM | POA: Diagnosis not present

## 2022-03-25 DIAGNOSIS — Z79899 Other long term (current) drug therapy: Secondary | ICD-10-CM | POA: Diagnosis not present

## 2022-03-25 DIAGNOSIS — S069X1A Unspecified intracranial injury with loss of consciousness of 30 minutes or less, initial encounter: Secondary | ICD-10-CM | POA: Diagnosis not present

## 2022-03-25 DIAGNOSIS — R7989 Other specified abnormal findings of blood chemistry: Secondary | ICD-10-CM | POA: Diagnosis not present

## 2022-03-25 DIAGNOSIS — S0081XA Abrasion of other part of head, initial encounter: Secondary | ICD-10-CM | POA: Diagnosis not present

## 2022-03-25 DIAGNOSIS — R42 Dizziness and giddiness: Secondary | ICD-10-CM | POA: Diagnosis not present

## 2022-03-25 DIAGNOSIS — W0110XA Fall on same level from slipping, tripping and stumbling with subsequent striking against unspecified object, initial encounter: Secondary | ICD-10-CM | POA: Diagnosis not present

## 2022-03-25 DIAGNOSIS — S0990XA Unspecified injury of head, initial encounter: Secondary | ICD-10-CM | POA: Diagnosis not present

## 2022-03-25 DIAGNOSIS — R569 Unspecified convulsions: Secondary | ICD-10-CM | POA: Diagnosis not present

## 2022-03-25 DIAGNOSIS — R55 Syncope and collapse: Secondary | ICD-10-CM | POA: Diagnosis not present

## 2022-03-26 DIAGNOSIS — E785 Hyperlipidemia, unspecified: Secondary | ICD-10-CM | POA: Diagnosis not present

## 2022-03-26 DIAGNOSIS — R569 Unspecified convulsions: Secondary | ICD-10-CM | POA: Diagnosis not present

## 2022-03-26 DIAGNOSIS — R7989 Other specified abnormal findings of blood chemistry: Secondary | ICD-10-CM | POA: Diagnosis not present

## 2022-03-26 DIAGNOSIS — I10 Essential (primary) hypertension: Secondary | ICD-10-CM | POA: Diagnosis not present

## 2022-03-26 DIAGNOSIS — Z79899 Other long term (current) drug therapy: Secondary | ICD-10-CM | POA: Diagnosis not present

## 2022-03-26 DIAGNOSIS — G40909 Epilepsy, unspecified, not intractable, without status epilepticus: Secondary | ICD-10-CM | POA: Diagnosis not present

## 2022-03-26 DIAGNOSIS — Z7982 Long term (current) use of aspirin: Secondary | ICD-10-CM | POA: Diagnosis not present

## 2022-03-26 DIAGNOSIS — Z8673 Personal history of transient ischemic attack (TIA), and cerebral infarction without residual deficits: Secondary | ICD-10-CM | POA: Diagnosis not present

## 2022-03-27 ENCOUNTER — Telehealth: Payer: Self-pay

## 2022-03-27 NOTE — Transitions of Care (Post Inpatient/ED Visit) (Signed)
   03/27/2022  Name: Carl Horne MRN: [id number] DOB: Jul 22, 1945  Today's TOC FU Call Status: Today's TOC FU Call Status:: Unsuccessul Call (1st Attempt) Unsuccessful Call (1st Attempt) Date: 03/27/22  Attempted to reach the patient regarding the most recent Inpatient/ED visit.  Follow Up Plan: Additional outreach attempts will be made to reach the patient to complete the Transitions of Care (Post Inpatient/ED visit) call.   Johnney Killian, RN, BSN, CCM Care Management Coordinator [place] Phone: [phone]: [phone]

## 2022-03-28 ENCOUNTER — Encounter: Payer: Self-pay | Admitting: *Deleted

## 2022-03-28 ENCOUNTER — Telehealth: Payer: Self-pay | Admitting: *Deleted

## 2022-03-28 NOTE — Transitions of Care (Post Inpatient/ED Visit) (Signed)
   03/28/2022  Name: Carl Horne MRN: [id number] DOB: [birth year]  Today's TOC FU Call Status: Today's TOC FU Call Status:: Successful TOC FU Call Competed TOC FU Call Complete Date: 03/28/22  Transition Care Management Follow-up Telephone Call Date of Discharge: 03/26/22 Discharge Facility: Other ([facility]) Name of Other (Non-[facility]) Discharge Facility: [facility] Type of Discharge: Inpatient Admission Primary Inpatient Discharge Diagnosis:: New onset Seizure How have you been since you were released from the hospital?: Better Any questions or concerns?: No  Items Reviewed: Did you receive and understand the discharge instructions provided?: Yes Medications obtained and verified?: Yes (Medications Reviewed) (Keppra) Any new allergies since your discharge?: No Dietary orders reviewed?: NA Do you have support at home?: Yes People in Home: child(ren), adult, spouse Name of Support/Comfort Primary Source: [facility] and Equipment/Supplies: [place] Ordered?: No Any new equipment or medical supplies ordered?: No  Functional Questionnaire: Do you need assistance with bathing/showering or dressing?: No Do you need assistance with meal preparation?: No Do you need assistance with eating?: No Do you have difficulty maintaining continence: No Do you need assistance with getting out of bed/getting out of a chair/moving?: No Do you have difficulty managing or taking your medications?: No  Follow up appointments reviewed: PCP Follow-up appointment confirmed?: Yes Date of PCP follow-up appointment?: 03/30/22 Follow-up Provider: Donzetta Kohut, [facility] Follow-up appointment confirmed?: No (Needs to f/u with neuro) Reason Specialist Follow-Up Not Confirmed: [facility] Calling Clinician Notified Provider Practice of Needed Appointment Do you need transportation to your follow-up appointment?: No Do you understand care options if your condition(s)  worsen?: Yes-patient verbalized understanding  SDOH Interventions Today    Flowsheet Row Most Recent Value  SDOH Interventions   Food Insecurity Interventions Intervention Not Indicated  Transportation Interventions Intervention Not Indicated  Utilities Interventions Intervention Not Indicated  Financial Strain Interventions Intervention Not Indicated      Chong Sicilian, BSN, RN-BC RN Care Coordinator [place]: [phone] Main #: [phone]

## 2022-03-30 ENCOUNTER — Ambulatory Visit (INDEPENDENT_AMBULATORY_CARE_PROVIDER_SITE_OTHER): Payer: Medicare PPO | Admitting: Family Medicine

## 2022-03-30 ENCOUNTER — Encounter: Payer: Self-pay | Admitting: Family Medicine

## 2022-03-30 VITALS — BP 131/71 | HR 78 | Temp 97.6°F | Ht 70.0 in | Wt 191.0 lb

## 2022-03-30 DIAGNOSIS — R569 Unspecified convulsions: Secondary | ICD-10-CM

## 2022-03-30 DIAGNOSIS — I10 Essential (primary) hypertension: Secondary | ICD-10-CM | POA: Diagnosis not present

## 2022-03-30 DIAGNOSIS — I499 Cardiac arrhythmia, unspecified: Secondary | ICD-10-CM

## 2022-03-30 DIAGNOSIS — R011 Cardiac murmur, unspecified: Secondary | ICD-10-CM | POA: Diagnosis not present

## 2022-03-30 NOTE — Progress Notes (Signed)
Subjective   Patient ID: Carl Horne, male    DOB: [birth year]  Age: 77 y.o. MRN: [id number]  CC: Hospital follow up  HPI Carl Horne presents to follow [place] post hospital admission for new onset seizure  Pt was admitted to [facility] in [place] on 03/25/22 for a new, witnessed seizure. He was loaded on Keppra at that time. Per notes, not to drive for 6 months and to follow up with neurology on discharge.  Pt has PMH of HTN, HLD, and had elevated creatinine in hospital with decreased GFR. Elevated troponin during hospital stay.   Per patient was standing outside talking to his grandson and felt dizzy/lightheaded and then walked to sit down and then woke up on the floor. Per patient states that his grandson said he was shaking a lot. States that he is taking Keppra.  Patient states he is taking HTN medications lists clonidine and lisinopril. Takes his lipitor. Takes ASA. Continues to take vitamin D (not regularly)  Has follow up MRI at [facility] tomorrow 03/31/22 at 1000 am  Denies changes to vision, confusion, nausea, headaches.  States that he does not get a lot of sleep at night but that this has been going on for years. Tries to take tylenol PM to go to sleep at night.   States that he is regularly seen at the [place] and typically has his care through them.   Outpatient Encounter Medications as of 03/30/2022  Medication Sig   aspirin EC 81 MG tablet Take 81 mg by mouth daily.   atorvastatin (LIPITOR) 40 MG tablet Take 40 mg by mouth daily.   Cholecalciferol (VITAMIN D) 2000 UNITS CAPS Take 1 capsule by mouth daily.   cloNIDine (CATAPRES) 0.3 MG tablet Take 1 tablet (0.3 mg total) by mouth 2 (two) times daily. (Patient taking differently: Take 0.6 mg by mouth 2 (two) times daily.)   lisinopril (PRINIVIL,ZESTRIL) 40 MG tablet Take 40 mg by mouth daily.   No facility-administered encounter medications on file as of 03/30/2022.   Past Medical History:  Diagnosis Date   Cat scratch fever     Herniated cervical disc    Hyperlipidemia    Hypertension    Past Surgical History:  Procedure Laterality Date   CHOLECYSTECTOMY     TONSILLECTOMY     Family History  Problem Relation Age of Onset   Liver disease Mother    Diabetes Father    Heart disease Father    Healthy Sister    Obesity Sister    Diabetes Brother    Kidney disease Brother    Healthy Daughter    Healthy Daughter    Social History   Socioeconomic History   Marital status: Married    Spouse name: [name]   Number of children: 2   Years of education: 12   Highest education level: 12th grade  Occupational History   Occupation: Self Employed    Comment: Air cabin crew  Tobacco Use   Smoking status: Every Day    Packs/day: 1.00    Years: 45.00    Additional pack years: 0.00    Total pack years: 45.00    Types: Cigars, Cigarettes   Smokeless tobacco: Never  Vaping Use   Vaping Use: Never used  Substance and Sexual Activity   Alcohol use: No   Drug use: No   Sexual activity: Yes  Other Topics Concern   Not on file  Social History Narrative   Lives home with wife -  daughter and grandson live with them now.   He goes to the [facility] routinely for check ups, eye and ear exams, vaccines and screenings.   Social Determinants of Health   Financial Resource Strain: Low Risk  (03/28/2022)   Overall Financial Resource Strain (CARDIA)    Difficulty of Paying Living Expenses: Not hard at all  Food Insecurity: No Food Insecurity (03/28/2022)   Hunger Vital Sign    Worried About Running Out of Food in the Last Year: Never true    Ran Out of Food in the Last Year: Never true  Transportation Needs: No Transportation Needs (03/28/2022)   PRAPARE - Hydrologist (Medical): No    Lack of Transportation (Non-Medical): No  Physical Activity: Sufficiently Active (06/27/2021)   Exercise Vital Sign    Days of Exercise per Week: 3 days    Minutes of Exercise per Session: 60 min  Stress:  No Stress Concern Present (06/27/2021)   [place]    Feeling of Stress : Not at all  Social Connections: Moderately Isolated (06/27/2021)   Social Connection and Isolation Panel [NHANES]    Frequency of Communication with Friends and Family: More than three times a week    Frequency of Social Gatherings with Friends and Family: More than three times a week    Attends Religious Services: Never    Marine scientist or Organizations: No    Attends Archivist Meetings: Never    Marital Status: Married  Human resources officer Violence: Not At Risk (06/27/2021)   Humiliation, Afraid, Rape, and Kick questionnaire    Fear of Current or Ex-Partner: No    Emotionally Abused: No    Physically Abused: No    Sexually Abused: No    ROS As per HPI  Objective       03/30/2022    2:00 PM 03/30/2022    1:59 PM 09/22/2021   11:52 AM  Vitals with BMI  Height  5\' 10"  5\' 10"   Weight  191 lbs 193 lbs  BMI  [id number]  Systolic A999333 [id number] 0000000  Diastolic 71 82 74  Pulse  78 70   Physical Exam Constitutional:      General: He is not in acute distress.    Appearance: Normal appearance. He is not ill-appearing, toxic-appearing or diaphoretic.  Eyes:     Extraocular Movements: Extraocular movements intact.     Pupils: Pupils are equal, round, and reactive to light.  Cardiovascular:     Rate and Rhythm: Normal rate.     Pulses: Normal pulses.     Heart sounds: Normal heart sounds. No murmur heard.    No gallop.  Pulmonary:     Effort: Pulmonary effort is normal. No respiratory distress.     Breath sounds: Normal breath sounds. No stridor. No wheezing, rhonchi or rales.  Skin:    General: Skin is warm.     Capillary Refill: Capillary refill takes less than 2 seconds.  Neurological:     General: No focal deficit present.     Mental Status: He is alert and oriented to person, place, and time. Mental status is at baseline.      Cranial Nerves: No cranial nerve deficit.     Sensory: No sensory deficit.     Motor: No weakness.     Gait: Gait normal.  Psychiatric:        Mood and Affect: Mood normal.  Behavior: Behavior normal.        Thought Content: Thought content normal.        Judgment: Judgment normal.    Assessment & Plan:  1. Seizure ([place]) Labs as below. Will communicate results to patient once available.  Will repeat labs as below. Patient had renal involvement and elevated troponins during ED admission. Per ED notes, attributed trops to seizure activity, low concern for ischemic involvement.  - Ambulatory referral to Neurology - Levetiracetam level - CBC with Differential/Platelet - CMP14+EGFR  2. Murmur, cardiac New onset murmur. Patient states that he is established with Cardiology through the [place]. Will refer below to make sure that patient has follow up for Murmur, ectopy, and recent syncopal/seizure episode.  - Ambulatory referral to Cardiology  3. Irregular heart beat Irregular heart beat appreciated on exam today. EKG obtained with new ectopy when compared to EKG from ED admission. Will obtain CMP as above to monitor for electrolytes.   - EKG 12-Lead - Ambulatory referral to Cardiology  4. Primary hypertension Repeat BP controlled in office today. Referral placed for Cardiology. Will continue to monitor.   The above assessment and management plan was discussed with the patient. The patient verbalized understanding of and has agreed to the management plan using shared-decision making. Patient is aware to call the clinic if they develop any new symptoms or if symptoms fail to improve or worsen. Patient is aware when to return to the clinic for a follow-up visit. Patient educated on when it is appropriate to go to the emergency department.   Donzetta Kohut, DNP-FNP [facility] [address] [place], [place] [address] [phone]

## 2022-03-31 DIAGNOSIS — Z8673 Personal history of transient ischemic attack (TIA), and cerebral infarction without residual deficits: Secondary | ICD-10-CM | POA: Diagnosis not present

## 2022-03-31 DIAGNOSIS — R569 Unspecified convulsions: Secondary | ICD-10-CM | POA: Diagnosis not present

## 2022-03-31 LAB — CBC WITH DIFFERENTIAL/PLATELET
Basophils Absolute: 0 10*3/uL (ref 0.0–0.2)
Basos: 0 %
EOS (ABSOLUTE): 0.2 10*3/uL (ref 0.0–0.4)
Eos: 2 %
Hematocrit: 46.8 % (ref 37.5–51.0)
Hemoglobin: 15.3 g/dL (ref 13.0–17.7)
Immature Grans (Abs): 0 10*3/uL (ref 0.0–0.1)
Immature Granulocytes: 0 %
Lymphocytes Absolute: 2.2 10*3/uL (ref 0.7–3.1)
Lymphs: 23 %
MCH: 30.1 pg (ref 26.6–33.0)
MCHC: 32.7 g/dL (ref 31.5–35.7)
MCV: 92 fL (ref 79–97)
Monocytes Absolute: 1 10*3/uL — ABNORMAL HIGH (ref 0.1–0.9)
Monocytes: 10 %
Neutrophils Absolute: 6.2 10*3/uL (ref 1.4–7.0)
Neutrophils: 65 %
Platelets: 302 10*3/uL (ref 150–450)
RBC: 5.09 x10E6/uL (ref 4.14–5.80)
RDW: 12.7 % (ref 11.6–15.4)
WBC: 9.7 10*3/uL (ref 3.4–10.8)

## 2022-03-31 LAB — CMP14+EGFR
ALT: 14 IU/L (ref 0–44)
AST: 15 IU/L (ref 0–40)
Albumin/Globulin Ratio: 1.4 (ref 1.2–2.2)
Albumin: 4.4 g/dL (ref 3.8–4.8)
Alkaline Phosphatase: 152 IU/L — ABNORMAL HIGH (ref 44–121)
BUN/Creatinine Ratio: 9 — ABNORMAL LOW (ref 10–24)
BUN: 12 mg/dL (ref 8–27)
Bilirubin Total: 0.5 mg/dL (ref 0.0–1.2)
CO2: 26 mmol/L (ref 20–29)
Calcium: 9.6 mg/dL (ref 8.6–10.2)
Chloride: 101 mmol/L (ref 96–106)
Creatinine, Ser: 1.31 mg/dL — ABNORMAL HIGH (ref 0.76–1.27)
Globulin, Total: 3.1 g/dL (ref 1.5–4.5)
Glucose: 82 mg/dL (ref 70–99)
Potassium: 4.8 mmol/L (ref 3.5–5.2)
Sodium: 143 mmol/L (ref 134–144)
Total Protein: 7.5 g/dL (ref 6.0–8.5)
eGFR: 57 mL/min/{1.73_m2} — ABNORMAL LOW (ref >59)

## 2022-03-31 LAB — LEVETIRACETAM LEVEL: Levetiracetam Lvl: 20.2 ug/mL (ref 10.0–40.0)

## 2022-04-03 DEATH — deceased

## 2022-04-07 ENCOUNTER — Telehealth: Payer: Self-pay

## 2022-04-07 NOTE — Telephone Encounter (Signed)
Incoming call from [facility].  Can you look it Carl Horne and sign death certificate   [id number]

## 2023-01-04 IMAGING — US US SOFT TISSUE HEAD/NECK
1 series · 11 of 11 positions shown · non-contrast
Comparison: None.

CLINICAL DATA: 74-year-old male with right neck swelling for the
past 5 days

EXAM:
ULTRASOUND OF HEAD/NECK SOFT TISSUES
TECHNIQUE: Ultrasound examination of the head and neck soft tissues was
performed in the area of clinical concern.

[Series 1: us soft tissue head/neck · 0.07mm/px · 11 acquisitions, 11 frames shown]
[im 1/11]
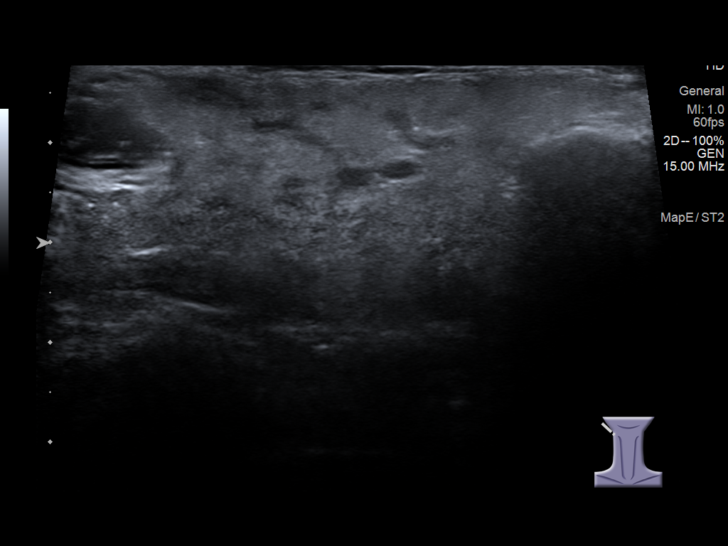
[im 2/11]
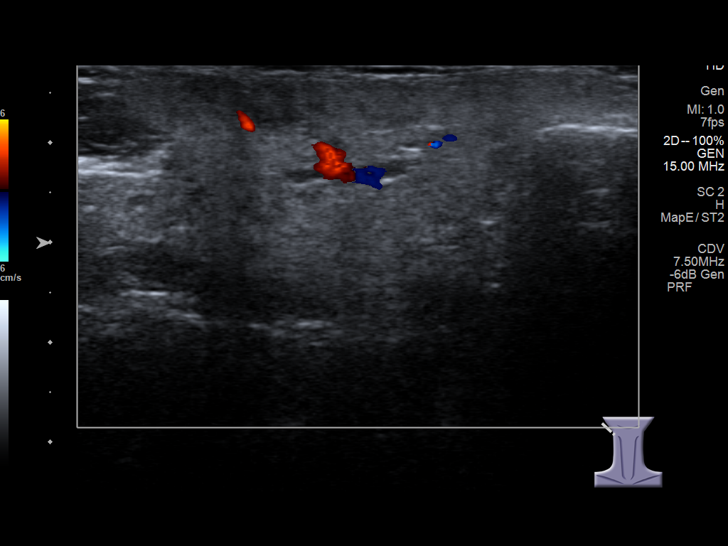
[im 3/11]
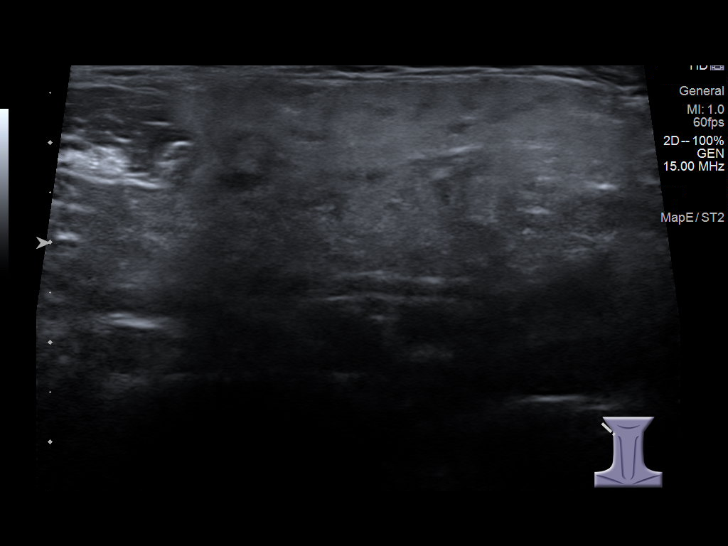
[im 4/11]
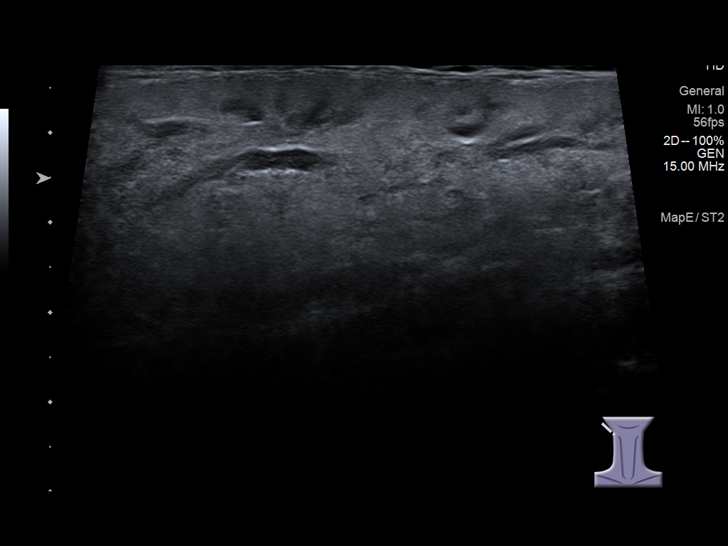
[im 5/11]
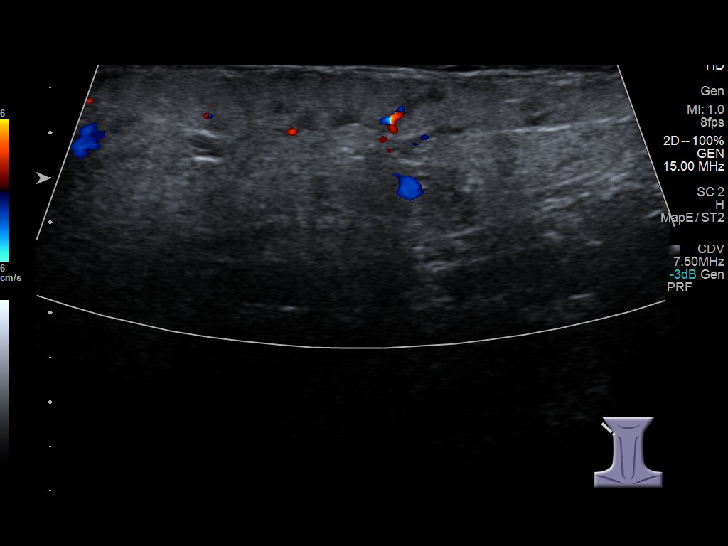
[im 6/11]
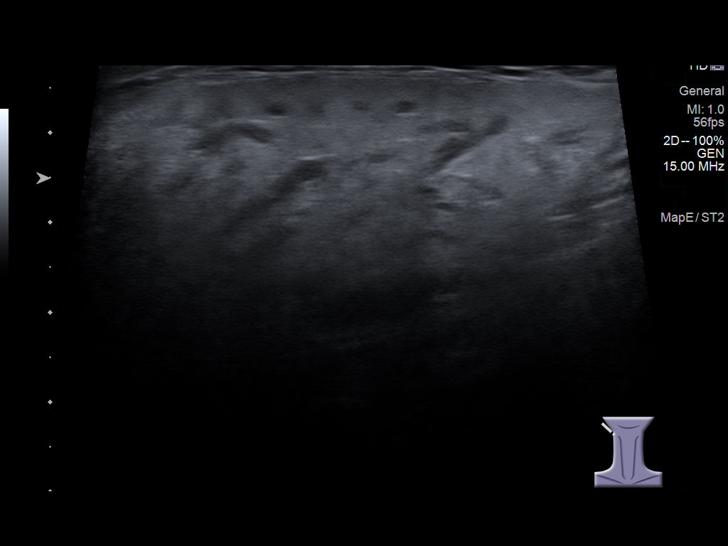
[im 7/11]
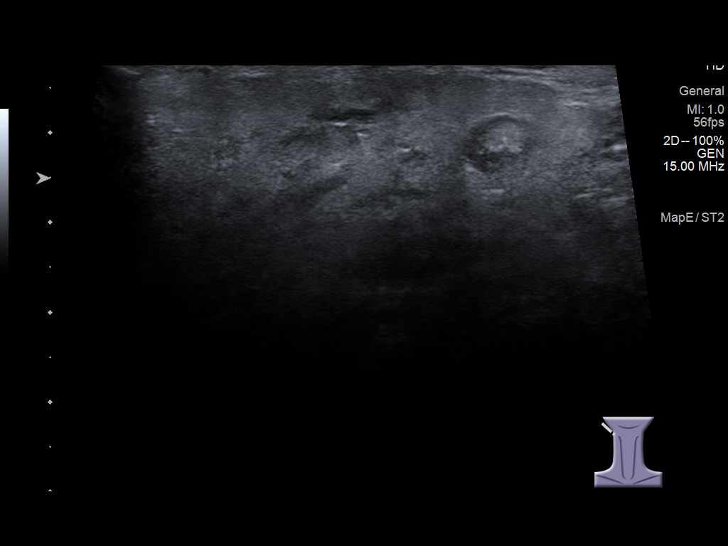
[im 8/11]
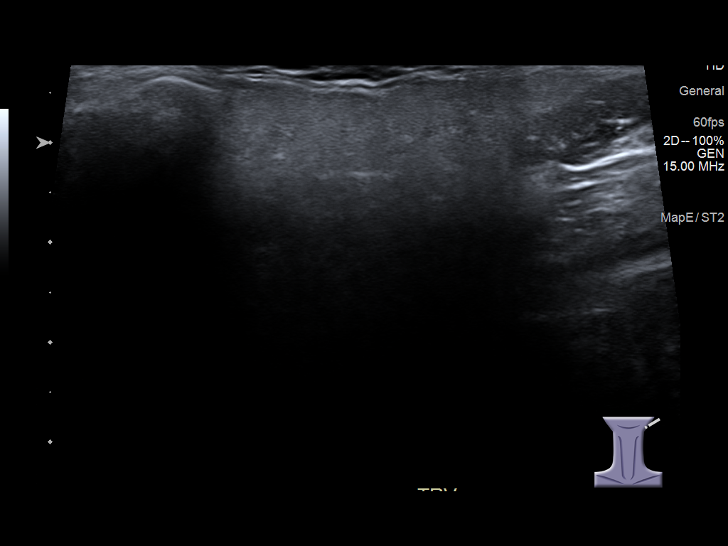
[im 9/11]
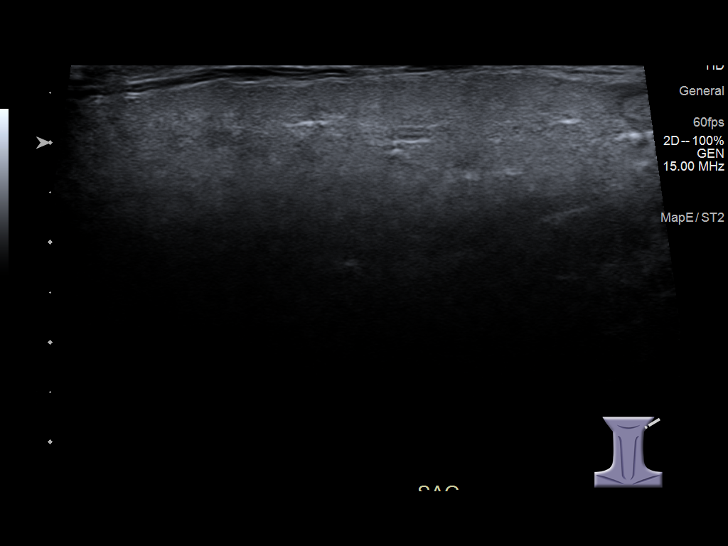
[im 10/11]
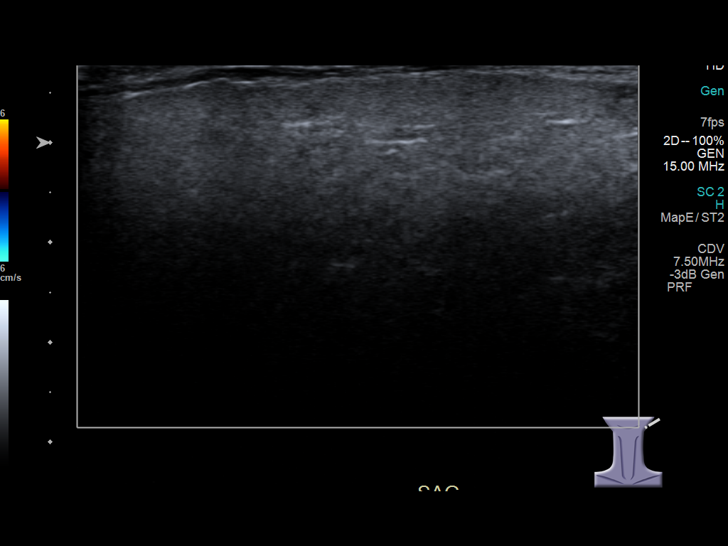
[im 11/11]
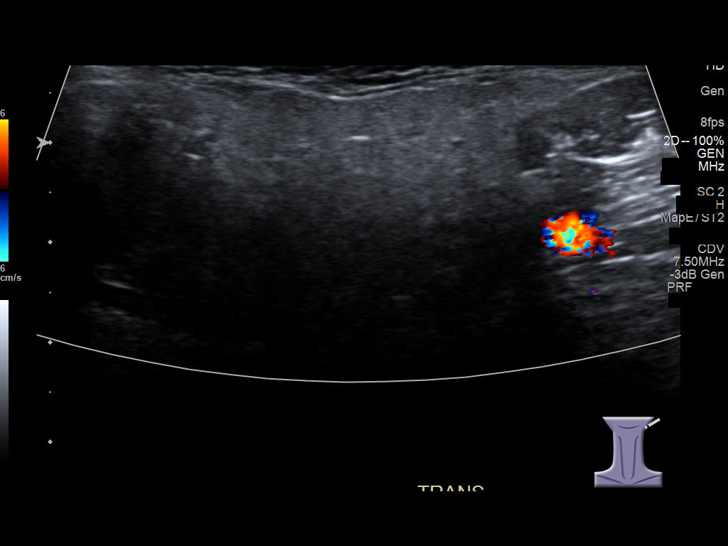

[11 of 11 positions shown; findings below may reference images not displayed]

FINDINGS: Sonographic interrogation of the region of clinical concern
demonstrates prominent submandibular glandular tissue. No
significant ductal ectasia. There is mild hypervascularity. No focal
mass or lesion.
IMPRESSION: The region of clinical concern corresponds to a prominent and
slightly hypervascular right submandibular gland. Findings suggest
sialoadenitis.
